# Patient Record
Sex: Female | Born: 1935 | Race: White | Hispanic: No | Marital: Married
Health system: Southern US, Community
[De-identification: ages and names within clinical notes are randomized; demographics above are authoritative.]

## PROBLEM LIST (undated history)

## (undated) DIAGNOSIS — E7800 Pure hypercholesterolemia, unspecified: Secondary | ICD-10-CM

## (undated) DIAGNOSIS — M549 Dorsalgia, unspecified: Secondary | ICD-10-CM

## (undated) DIAGNOSIS — Z950 Presence of cardiac pacemaker: Secondary | ICD-10-CM

## (undated) DIAGNOSIS — E1149 Type 2 diabetes mellitus with other diabetic neurological complication: Secondary | ICD-10-CM

## (undated) DIAGNOSIS — M1990 Unspecified osteoarthritis, unspecified site: Secondary | ICD-10-CM

## (undated) DIAGNOSIS — I4891 Unspecified atrial fibrillation: Secondary | ICD-10-CM

## (undated) DIAGNOSIS — M109 Gout, unspecified: Secondary | ICD-10-CM

## (undated) DIAGNOSIS — M5126 Other intervertebral disc displacement, lumbar region: Secondary | ICD-10-CM

## (undated) DIAGNOSIS — D509 Iron deficiency anemia, unspecified: Secondary | ICD-10-CM

## (undated) DIAGNOSIS — N183 Chronic kidney disease, stage 3 unspecified: Secondary | ICD-10-CM

## (undated) DIAGNOSIS — N281 Cyst of kidney, acquired: Secondary | ICD-10-CM

## (undated) DIAGNOSIS — E612 Magnesium deficiency: Secondary | ICD-10-CM

## (undated) DIAGNOSIS — I5189 Other ill-defined heart diseases: Secondary | ICD-10-CM

## (undated) DIAGNOSIS — G4730 Sleep apnea, unspecified: Secondary | ICD-10-CM

## (undated) DIAGNOSIS — C44601 Unspecified malignant neoplasm of skin of unspecified upper limb, including shoulder: Secondary | ICD-10-CM

## (undated) DIAGNOSIS — K2970 Gastritis, unspecified, without bleeding: Secondary | ICD-10-CM

## (undated) DIAGNOSIS — L309 Dermatitis, unspecified: Secondary | ICD-10-CM

## (undated) DIAGNOSIS — E669 Obesity, unspecified: Secondary | ICD-10-CM

## (undated) DIAGNOSIS — I442 Atrioventricular block, complete: Secondary | ICD-10-CM

## (undated) DIAGNOSIS — G8929 Other chronic pain: Secondary | ICD-10-CM

## (undated) DIAGNOSIS — M48061 Spinal stenosis, lumbar region without neurogenic claudication: Secondary | ICD-10-CM

## (undated) DIAGNOSIS — K219 Gastro-esophageal reflux disease without esophagitis: Secondary | ICD-10-CM

## (undated) DIAGNOSIS — M21619 Bunion of unspecified foot: Secondary | ICD-10-CM

## (undated) DIAGNOSIS — N289 Disorder of kidney and ureter, unspecified: Secondary | ICD-10-CM

## (undated) DIAGNOSIS — J309 Allergic rhinitis, unspecified: Secondary | ICD-10-CM

## (undated) DIAGNOSIS — E1142 Type 2 diabetes mellitus with diabetic polyneuropathy: Secondary | ICD-10-CM

## (undated) DIAGNOSIS — E559 Vitamin D deficiency, unspecified: Secondary | ICD-10-CM

## (undated) DIAGNOSIS — I10 Essential (primary) hypertension: Secondary | ICD-10-CM

## (undated) DIAGNOSIS — L80 Vitiligo: Secondary | ICD-10-CM

## (undated) DIAGNOSIS — R51 Headache: Secondary | ICD-10-CM

## (undated) DIAGNOSIS — K635 Polyp of colon: Secondary | ICD-10-CM

## (undated) HISTORY — DX: Magnesium deficiency: E61.2

## (undated) HISTORY — DX: Dermatitis, unspecified: L30.9

## (undated) HISTORY — DX: Other ill-defined heart diseases: I51.89

## (undated) HISTORY — DX: Vitamin D deficiency, unspecified: E55.9

## (undated) HISTORY — DX: Polyp of colon: K63.5

## (undated) HISTORY — DX: Obesity, unspecified: E66.9

## (undated) HISTORY — DX: Unspecified osteoarthritis, unspecified site: M19.90

## (undated) HISTORY — DX: Gastritis, unspecified, without bleeding: K29.70

## (undated) HISTORY — PX: CARDIAC CATHETERIZATION: SHX172

## (undated) HISTORY — DX: Disorder of kidney and ureter, unspecified: N28.9

## (undated) HISTORY — DX: Type 2 diabetes mellitus with other diabetic neurological complication: E11.49

## (undated) HISTORY — DX: Vitiligo: L80

## (undated) HISTORY — DX: Type 2 diabetes mellitus with diabetic polyneuropathy: E11.42

## (undated) HISTORY — DX: Allergic rhinitis, unspecified: J30.9

## (undated) HISTORY — DX: Atrioventricular block, complete: I44.2

## (undated) HISTORY — PX: BACK SURGERY: SHX140

## (undated) HISTORY — PX: SKIN CANCER EXCISION: SHX779

## (undated) HISTORY — PX: JOINT REPLACEMENT: SHX530

## (undated) HISTORY — DX: Cyst of kidney, acquired: N28.1

## (undated) HISTORY — DX: Chronic kidney disease, stage 3 unspecified: N18.30

## (undated) HISTORY — DX: Other intervertebral disc displacement, lumbar region: M51.26

## (undated) HISTORY — DX: Bunion of unspecified foot: M21.619

## (undated) HISTORY — DX: Chronic kidney disease, stage 3 (moderate): N18.3

---

## 1979-06-30 HISTORY — PX: EXCISIONAL HEMORRHOIDECTOMY: SHX1541

## 1989-06-29 HISTORY — PX: CATARACT EXTRACTION W/ INTRAOCULAR LENS  IMPLANT, BILATERAL: SHX1307

## 1998-07-20 ENCOUNTER — Encounter: Payer: Self-pay | Admitting: Family Medicine

## 1998-07-20 ENCOUNTER — Ambulatory Visit (HOSPITAL_COMMUNITY): Admission: RE | Admit: 1998-07-20 | Discharge: 1998-07-20 | Payer: Self-pay | Admitting: Family Medicine

## 1998-12-14 ENCOUNTER — Ambulatory Visit: Admission: RE | Admit: 1998-12-14 | Discharge: 1998-12-14 | Payer: Self-pay

## 1999-08-02 ENCOUNTER — Encounter: Payer: Self-pay | Admitting: *Deleted

## 1999-08-02 ENCOUNTER — Ambulatory Visit (HOSPITAL_COMMUNITY): Admission: RE | Admit: 1999-08-02 | Discharge: 1999-08-02 | Payer: Self-pay | Admitting: *Deleted

## 2000-04-18 ENCOUNTER — Ambulatory Visit (HOSPITAL_COMMUNITY): Admission: RE | Admit: 2000-04-18 | Discharge: 2000-04-18 | Payer: Self-pay | Admitting: Gastroenterology

## 2000-04-18 ENCOUNTER — Encounter (INDEPENDENT_AMBULATORY_CARE_PROVIDER_SITE_OTHER): Payer: Self-pay | Admitting: Specialist

## 2000-10-02 ENCOUNTER — Encounter: Payer: Self-pay | Admitting: *Deleted

## 2000-10-02 ENCOUNTER — Ambulatory Visit (HOSPITAL_COMMUNITY): Admission: RE | Admit: 2000-10-02 | Discharge: 2000-10-02 | Payer: Self-pay | Admitting: *Deleted

## 2000-10-07 ENCOUNTER — Other Ambulatory Visit: Admission: RE | Admit: 2000-10-07 | Discharge: 2000-10-07 | Payer: Self-pay | Admitting: *Deleted

## 2002-02-23 ENCOUNTER — Emergency Department (HOSPITAL_COMMUNITY): Admission: EM | Admit: 2002-02-23 | Discharge: 2002-02-23 | Payer: Self-pay | Admitting: Emergency Medicine

## 2002-02-23 ENCOUNTER — Encounter: Payer: Self-pay | Admitting: Emergency Medicine

## 2002-02-28 ENCOUNTER — Emergency Department (HOSPITAL_COMMUNITY): Admission: EM | Admit: 2002-02-28 | Discharge: 2002-02-28 | Payer: Self-pay | Admitting: Emergency Medicine

## 2002-03-02 ENCOUNTER — Emergency Department (HOSPITAL_COMMUNITY): Admission: EM | Admit: 2002-03-02 | Discharge: 2002-03-02 | Payer: Self-pay | Admitting: Emergency Medicine

## 2002-08-27 ENCOUNTER — Encounter: Payer: Self-pay | Admitting: *Deleted

## 2002-08-27 ENCOUNTER — Ambulatory Visit (HOSPITAL_COMMUNITY): Admission: RE | Admit: 2002-08-27 | Discharge: 2002-08-27 | Payer: Self-pay | Admitting: *Deleted

## 2002-11-10 ENCOUNTER — Ambulatory Visit (HOSPITAL_COMMUNITY): Admission: RE | Admit: 2002-11-10 | Discharge: 2002-11-11 | Payer: Self-pay | Admitting: Cardiology

## 2002-11-10 HISTORY — PX: PACEMAKER INSERTION: SHX728

## 2002-11-11 ENCOUNTER — Encounter: Payer: Self-pay | Admitting: Cardiology

## 2002-11-30 ENCOUNTER — Ambulatory Visit (HOSPITAL_COMMUNITY): Admission: RE | Admit: 2002-11-30 | Discharge: 2002-11-30 | Payer: Self-pay | Admitting: Gastroenterology

## 2002-11-30 ENCOUNTER — Encounter (INDEPENDENT_AMBULATORY_CARE_PROVIDER_SITE_OTHER): Payer: Self-pay | Admitting: Specialist

## 2003-04-20 ENCOUNTER — Encounter: Admission: RE | Admit: 2003-04-20 | Discharge: 2003-07-19 | Payer: Self-pay | Admitting: *Deleted

## 2005-04-06 ENCOUNTER — Ambulatory Visit (HOSPITAL_COMMUNITY): Admission: RE | Admit: 2005-04-06 | Discharge: 2005-04-06 | Payer: Self-pay | Admitting: Family Medicine

## 2005-10-18 ENCOUNTER — Ambulatory Visit (HOSPITAL_COMMUNITY): Admission: RE | Admit: 2005-10-18 | Discharge: 2005-10-18 | Payer: Self-pay | Admitting: *Deleted

## 2005-10-24 ENCOUNTER — Encounter (INDEPENDENT_AMBULATORY_CARE_PROVIDER_SITE_OTHER): Payer: Self-pay | Admitting: Specialist

## 2005-10-24 ENCOUNTER — Ambulatory Visit (HOSPITAL_COMMUNITY): Admission: RE | Admit: 2005-10-24 | Discharge: 2005-10-24 | Payer: Self-pay | Admitting: Gastroenterology

## 2006-01-02 ENCOUNTER — Ambulatory Visit: Payer: Self-pay | Admitting: Oncology

## 2006-02-24 ENCOUNTER — Ambulatory Visit: Payer: Self-pay | Admitting: Oncology

## 2006-02-26 LAB — CBC WITH DIFFERENTIAL/PLATELET
BASO%: 0.6 % (ref 0.0–2.0)
EOS%: 2.8 % (ref 0.0–7.0)
LYMPH%: 31.7 % (ref 14.0–48.0)
MCHC: 32.7 g/dL (ref 32.0–36.0)
MCV: 77.8 fL — ABNORMAL LOW (ref 81.0–101.0)
MONO%: 8.4 % (ref 0.0–13.0)
Platelets: 314 10*3/uL (ref 145–400)
RBC: 4.68 10*6/uL (ref 3.70–5.32)
RDW: 27.3 % — ABNORMAL HIGH (ref 11.3–14.5)

## 2006-02-27 LAB — IRON AND TIBC
Iron: 60 ug/dL (ref 42–145)
UIBC: 261 ug/dL

## 2006-02-27 LAB — FERRITIN: Ferritin: 550 ng/mL — ABNORMAL HIGH (ref 10–291)

## 2006-04-01 ENCOUNTER — Encounter: Payer: Self-pay | Admitting: Vascular Surgery

## 2006-04-01 ENCOUNTER — Ambulatory Visit: Admission: RE | Admit: 2006-04-01 | Discharge: 2006-04-01 | Payer: Self-pay | Admitting: Family Medicine

## 2006-04-19 ENCOUNTER — Ambulatory Visit: Payer: Self-pay | Admitting: Oncology

## 2006-04-23 LAB — IRON AND TIBC
Iron: 73 ug/dL (ref 42–145)
TIBC: 324 ug/dL (ref 250–470)
UIBC: 251 ug/dL

## 2006-04-23 LAB — CBC WITH DIFFERENTIAL/PLATELET
Basophils Absolute: 0 10*3/uL (ref 0.0–0.1)
Eosinophils Absolute: 0.2 10*3/uL (ref 0.0–0.5)
HCT: 39.1 % (ref 34.8–46.6)
HGB: 13.3 g/dL (ref 11.6–15.9)
MONO#: 0.5 10*3/uL (ref 0.1–0.9)
NEUT%: 53 % (ref 39.6–76.8)
Platelets: 276 10*3/uL (ref 145–400)
WBC: 7.1 10*3/uL (ref 3.9–10.0)
lymph#: 2.5 10*3/uL (ref 0.9–3.3)

## 2006-04-28 HISTORY — PX: TOTAL KNEE ARTHROPLASTY: SHX125

## 2006-04-28 HISTORY — PX: ANKLE FRACTURE SURGERY: SHX122

## 2006-06-28 ENCOUNTER — Ambulatory Visit: Payer: Self-pay | Admitting: Oncology

## 2006-06-28 LAB — CBC WITH DIFFERENTIAL/PLATELET
Basophils Absolute: 0 10*3/uL (ref 0.0–0.1)
EOS%: 3.1 % (ref 0.0–7.0)
Eosinophils Absolute: 0.3 10*3/uL (ref 0.0–0.5)
HGB: 9.1 g/dL — ABNORMAL LOW (ref 11.6–15.9)
LYMPH%: 23.9 % (ref 14.0–48.0)
MCH: 25.6 pg — ABNORMAL LOW (ref 26.0–34.0)
MCV: 79 fL — ABNORMAL LOW (ref 81.0–101.0)
MONO%: 9.4 % (ref 0.0–13.0)
NEUT#: 5.3 10*3/uL (ref 1.5–6.5)
NEUT%: 63.1 % (ref 39.6–76.8)
Platelets: 623 10*3/uL — ABNORMAL HIGH (ref 145–400)
RDW: 20.5 % — ABNORMAL HIGH (ref 11.3–14.5)

## 2006-07-01 LAB — COMPREHENSIVE METABOLIC PANEL
ALT: <8 U/L (ref 0–40)
Albumin: 2.9 g/dL — ABNORMAL LOW (ref 3.5–5.2)
CO2: 23 mEq/L (ref 19–32)
Calcium: 5.5 mg/dL — CL (ref 8.4–10.5)
Chloride: 101 mEq/L (ref 96–112)
Glucose, Bld: 160 mg/dL — ABNORMAL HIGH (ref 70–99)
Potassium: 3.2 mEq/L — ABNORMAL LOW (ref 3.5–5.3)
Sodium: 141 mEq/L (ref 135–145)
Total Protein: 6.2 g/dL (ref 6.0–8.3)

## 2006-07-01 LAB — IRON AND TIBC
Iron: 12 ug/dL — ABNORMAL LOW (ref 42–145)
UIBC: 141 ug/dL

## 2006-07-03 LAB — MAGNESIUM: Magnesium: 0.5 mg/dL — CL (ref 1.5–2.5)

## 2006-07-12 LAB — CBC WITH DIFFERENTIAL/PLATELET
Basophils Absolute: 0.1 10*3/uL (ref 0.0–0.1)
Eosinophils Absolute: 0.1 10*3/uL (ref 0.0–0.5)
HCT: 30.3 % — ABNORMAL LOW (ref 34.8–46.6)
HGB: 9.8 g/dL — ABNORMAL LOW (ref 11.6–15.9)
MONO#: 0.5 10*3/uL (ref 0.1–0.9)
NEUT#: 5.6 10*3/uL (ref 1.5–6.5)
NEUT%: 69.6 % (ref 39.6–76.8)
RDW: 20.9 % — ABNORMAL HIGH (ref 11.3–14.5)
WBC: 8.1 10*3/uL (ref 3.9–10.0)
lymph#: 1.8 10*3/uL (ref 0.9–3.3)

## 2006-07-12 LAB — MAGNESIUM: Magnesium: 0.6 mg/dL — CL (ref 1.5–2.5)

## 2006-07-12 LAB — COMPREHENSIVE METABOLIC PANEL
Albumin: 2.9 g/dL — ABNORMAL LOW (ref 3.5–5.2)
BUN: 16 mg/dL (ref 6–23)
CO2: 22 mEq/L (ref 19–32)
Calcium: 6.6 mg/dL — ABNORMAL LOW (ref 8.4–10.5)
Chloride: 104 mEq/L (ref 96–112)
Creatinine, Ser: 0.94 mg/dL (ref 0.40–1.20)
Potassium: 3.8 mEq/L (ref 3.5–5.3)

## 2006-07-23 LAB — COMPREHENSIVE METABOLIC PANEL
AST: 14 U/L (ref 0–37)
Albumin: 3.5 g/dL (ref 3.5–5.2)
Alkaline Phosphatase: 106 U/L (ref 39–117)
Calcium: 9.4 mg/dL (ref 8.4–10.5)
Chloride: 102 mEq/L (ref 96–112)
Glucose, Bld: 99 mg/dL (ref 70–99)
Potassium: 4.7 mEq/L (ref 3.5–5.3)
Sodium: 139 mEq/L (ref 135–145)
Total Protein: 6.8 g/dL (ref 6.0–8.3)

## 2006-07-23 LAB — CBC WITH DIFFERENTIAL/PLATELET
BASO%: 0.8 % (ref 0.0–2.0)
Eosinophils Absolute: 0.2 10*3/uL (ref 0.0–0.5)
LYMPH%: 42.8 % (ref 14.0–48.0)
MCHC: 32 g/dL (ref 32.0–36.0)
MCV: 81.1 fL (ref 81.0–101.0)
MONO#: 0.5 10*3/uL (ref 0.1–0.9)
MONO%: 8.1 % (ref 0.0–13.0)
NEUT#: 2.8 10*3/uL (ref 1.5–6.5)
Platelets: 486 10*3/uL — ABNORMAL HIGH (ref 145–400)
RBC: 4.49 10*6/uL (ref 3.70–5.32)
RDW: 23.6 % — ABNORMAL HIGH (ref 11.3–14.5)
WBC: 6.3 10*3/uL (ref 3.9–10.0)

## 2006-07-23 LAB — MAGNESIUM: Magnesium: 1.1 mg/dL — ABNORMAL LOW (ref 1.5–2.5)

## 2006-08-06 ENCOUNTER — Encounter: Admission: RE | Admit: 2006-08-06 | Discharge: 2006-09-12 | Payer: Self-pay | Admitting: Orthopedic Surgery

## 2006-09-11 ENCOUNTER — Ambulatory Visit: Payer: Self-pay | Admitting: Oncology

## 2006-09-13 ENCOUNTER — Encounter: Admission: RE | Admit: 2006-09-13 | Discharge: 2006-10-03 | Payer: Self-pay | Admitting: Orthopedic Surgery

## 2006-09-13 LAB — COMPREHENSIVE METABOLIC PANEL
Albumin: 3.6 g/dL (ref 3.5–5.2)
Alkaline Phosphatase: 84 U/L (ref 39–117)
BUN: 17 mg/dL (ref 6–23)
CO2: 28 mEq/L (ref 19–32)
Glucose, Bld: 100 mg/dL — ABNORMAL HIGH (ref 70–99)
Potassium: 4.5 mEq/L (ref 3.5–5.3)
Total Bilirubin: 0.4 mg/dL (ref 0.3–1.2)

## 2006-09-13 LAB — CBC WITH DIFFERENTIAL/PLATELET
Basophils Absolute: 0.1 10*3/uL (ref 0.0–0.1)
Eosinophils Absolute: 0.2 10*3/uL (ref 0.0–0.5)
HGB: 12.6 g/dL (ref 11.6–15.9)
LYMPH%: 41.4 % (ref 14.0–48.0)
MCV: 85.2 fL (ref 81.0–101.0)
MONO#: 0.6 10*3/uL (ref 0.1–0.9)
MONO%: 7.7 % (ref 0.0–13.0)
NEUT#: 3.6 10*3/uL (ref 1.5–6.5)
Platelets: 283 10*3/uL (ref 145–400)

## 2006-09-13 LAB — LACTATE DEHYDROGENASE: LDH: 131 U/L (ref 94–250)

## 2006-09-13 LAB — IRON AND TIBC: TIBC: 248 ug/dL — ABNORMAL LOW (ref 250–470)

## 2006-09-13 LAB — FERRITIN: Ferritin: 444 ng/mL — ABNORMAL HIGH (ref 10–291)

## 2006-09-13 LAB — MAGNESIUM: Magnesium: 1.5 mg/dL (ref 1.5–2.5)

## 2006-10-04 ENCOUNTER — Encounter: Admission: RE | Admit: 2006-10-04 | Discharge: 2006-12-25 | Payer: Self-pay | Admitting: Orthopedic Surgery

## 2006-10-04 LAB — CBC WITH DIFFERENTIAL/PLATELET
BASO%: 0.7 % (ref 0.0–2.0)
Basophils Absolute: 0 10*3/uL (ref 0.0–0.1)
EOS%: 3.5 % (ref 0.0–7.0)
HGB: 11.8 g/dL (ref 11.6–15.9)
MCH: 28.6 pg (ref 26.0–34.0)
RDW: 17 % — ABNORMAL HIGH (ref 11.3–14.5)
lymph#: 3.2 10*3/uL (ref 0.9–3.3)

## 2006-10-25 LAB — CBC WITH DIFFERENTIAL/PLATELET
Basophils Absolute: 0 10*3/uL (ref 0.0–0.1)
Eosinophils Absolute: 0.2 10*3/uL (ref 0.0–0.5)
HGB: 13.1 g/dL (ref 11.6–15.9)
MCV: 85.7 fL (ref 81.0–101.0)
MONO#: 0.4 10*3/uL (ref 0.1–0.9)
NEUT#: 7.6 10*3/uL — ABNORMAL HIGH (ref 1.5–6.5)
RDW: 16.5 % — ABNORMAL HIGH (ref 11.3–14.5)
WBC: 10.7 10*3/uL — ABNORMAL HIGH (ref 3.9–10.0)
lymph#: 2.5 10*3/uL (ref 0.9–3.3)

## 2006-11-04 ENCOUNTER — Ambulatory Visit: Payer: Self-pay | Admitting: Oncology

## 2006-11-07 LAB — COMPREHENSIVE METABOLIC PANEL
AST: 35 U/L (ref 0–37)
Albumin: 3.7 g/dL (ref 3.5–5.2)
Alkaline Phosphatase: 115 U/L (ref 39–117)
Glucose, Bld: 109 mg/dL — ABNORMAL HIGH (ref 70–99)
Potassium: 3.9 mEq/L (ref 3.5–5.3)
Sodium: 141 mEq/L (ref 135–145)
Total Bilirubin: 0.4 mg/dL (ref 0.3–1.2)
Total Protein: 7 g/dL (ref 6.0–8.3)

## 2006-11-07 LAB — CBC WITH DIFFERENTIAL/PLATELET
Basophils Absolute: 0.1 10*3/uL (ref 0.0–0.1)
EOS%: 2.8 % (ref 0.0–7.0)
HCT: 37.2 % (ref 34.8–46.6)
HGB: 12.2 g/dL (ref 11.6–15.9)
LYMPH%: 43.7 % (ref 14.0–48.0)
MCH: 28 pg (ref 26.0–34.0)
MCV: 85 fL (ref 81.0–101.0)
NEUT%: 44.3 % (ref 39.6–76.8)
Platelets: 589 10*3/uL — ABNORMAL HIGH (ref 145–400)
lymph#: 3 10*3/uL (ref 0.9–3.3)

## 2006-11-07 LAB — IRON AND TIBC
%SAT: 23 % (ref 20–55)
Iron: 54 ug/dL (ref 42–145)
UIBC: 183 ug/dL

## 2006-11-07 LAB — FERRITIN: Ferritin: 438 ng/mL — ABNORMAL HIGH (ref 10–291)

## 2006-11-22 LAB — CBC WITH DIFFERENTIAL/PLATELET
BASO%: 0.6 % (ref 0.0–2.0)
EOS%: 2.9 % (ref 0.0–7.0)
HCT: 36.8 % (ref 34.8–46.6)
HGB: 12 g/dL (ref 11.6–15.9)
MCH: 27.8 pg (ref 26.0–34.0)
MCHC: 32.5 g/dL (ref 32.0–36.0)
MONO#: 0.5 10*3/uL (ref 0.1–0.9)
RDW: 17.5 % — ABNORMAL HIGH (ref 11.3–14.5)
WBC: 6.3 10*3/uL (ref 3.9–10.0)
lymph#: 2.8 10*3/uL (ref 0.9–3.3)

## 2006-12-06 ENCOUNTER — Encounter: Admission: RE | Admit: 2006-12-06 | Discharge: 2006-12-06 | Payer: Self-pay | Admitting: Orthopaedic Surgery

## 2006-12-17 ENCOUNTER — Ambulatory Visit: Payer: Self-pay | Admitting: Oncology

## 2006-12-19 LAB — CBC WITH DIFFERENTIAL/PLATELET
Basophils Absolute: 0 10*3/uL (ref 0.0–0.1)
EOS%: 3.3 % (ref 0.0–7.0)
Eosinophils Absolute: 0.2 10*3/uL (ref 0.0–0.5)
HCT: 37.6 % (ref 34.8–46.6)
HGB: 12.7 g/dL (ref 11.6–15.9)
MONO#: 0.5 10*3/uL (ref 0.1–0.9)
NEUT#: 3.3 10*3/uL (ref 1.5–6.5)
NEUT%: 48.1 % (ref 39.6–76.8)
RDW: 17 % — ABNORMAL HIGH (ref 11.3–14.5)
WBC: 6.8 10*3/uL (ref 3.9–10.0)
lymph#: 2.8 10*3/uL (ref 0.9–3.3)

## 2007-01-02 ENCOUNTER — Encounter: Admission: RE | Admit: 2007-01-02 | Discharge: 2007-01-02 | Payer: Self-pay | Admitting: Orthopaedic Surgery

## 2007-01-13 ENCOUNTER — Encounter: Admission: RE | Admit: 2007-01-13 | Discharge: 2007-01-13 | Payer: Self-pay | Admitting: Orthopaedic Surgery

## 2007-01-17 LAB — CBC WITH DIFFERENTIAL/PLATELET
BASO%: 0.5 % (ref 0.0–2.0)
Basophils Absolute: 0 10*3/uL (ref 0.0–0.1)
EOS%: 1.5 % (ref 0.0–7.0)
HCT: 39.3 % (ref 34.8–46.6)
LYMPH%: 31.2 % (ref 14.0–48.0)
MCH: 29 pg (ref 26.0–34.0)
MCHC: 34.6 g/dL (ref 32.0–36.0)
MCV: 83.8 fL (ref 81.0–101.0)
MONO%: 8.3 % (ref 0.0–13.0)
NEUT%: 58.5 % (ref 39.6–76.8)
Platelets: 300 10*3/uL (ref 145–400)

## 2007-02-11 ENCOUNTER — Ambulatory Visit: Payer: Self-pay | Admitting: Oncology

## 2007-02-14 LAB — CBC WITH DIFFERENTIAL/PLATELET
BASO%: 0.5 % (ref 0.0–2.0)
EOS%: 2.3 % (ref 0.0–7.0)
MCH: 29.8 pg (ref 26.0–34.0)
MCHC: 35 g/dL (ref 32.0–36.0)
MCV: 85.2 fL (ref 81.0–101.0)
MONO%: 7.4 % (ref 0.0–13.0)
RDW: 15.9 % — ABNORMAL HIGH (ref 11.3–14.5)
lymph#: 2.4 10*3/uL (ref 0.9–3.3)

## 2007-02-14 LAB — COMPREHENSIVE METABOLIC PANEL
ALT: 10 U/L (ref 0–35)
AST: 13 U/L (ref 0–37)
Albumin: 4 g/dL (ref 3.5–5.2)
Alkaline Phosphatase: 59 U/L (ref 39–117)
Calcium: 9.1 mg/dL (ref 8.4–10.5)
Chloride: 105 mEq/L (ref 96–112)
Creatinine, Ser: 1.13 mg/dL (ref 0.40–1.20)
Potassium: 4.1 mEq/L (ref 3.5–5.3)

## 2007-02-14 LAB — IRON AND TIBC
TIBC: 293 ug/dL (ref 250–470)
UIBC: 235 ug/dL

## 2007-02-14 LAB — FERRITIN: Ferritin: 299 ng/mL — ABNORMAL HIGH (ref 10–291)

## 2007-02-26 ENCOUNTER — Ambulatory Visit: Payer: Self-pay | Admitting: Pulmonary Disease

## 2007-04-09 ENCOUNTER — Ambulatory Visit: Payer: Self-pay | Admitting: Oncology

## 2007-04-10 ENCOUNTER — Encounter: Admission: RE | Admit: 2007-04-10 | Discharge: 2007-04-10 | Payer: Self-pay | Admitting: Orthopaedic Surgery

## 2007-04-11 ENCOUNTER — Ambulatory Visit (HOSPITAL_BASED_OUTPATIENT_CLINIC_OR_DEPARTMENT_OTHER): Admission: RE | Admit: 2007-04-11 | Discharge: 2007-04-11 | Payer: Self-pay | Admitting: Pulmonary Disease

## 2007-04-11 ENCOUNTER — Ambulatory Visit: Payer: Self-pay | Admitting: Pulmonary Disease

## 2007-04-11 LAB — CBC WITH DIFFERENTIAL/PLATELET
BASO%: 0.5 % (ref 0.0–2.0)
Basophils Absolute: 0 10*3/uL (ref 0.0–0.1)
EOS%: 1 % (ref 0.0–7.0)
HGB: 12.3 g/dL (ref 11.6–15.9)
MCH: 29.1 pg (ref 26.0–34.0)
MCV: 85.6 fL (ref 81.0–101.0)
MONO%: 7.1 % (ref 0.0–13.0)
RBC: 4.21 10*6/uL (ref 3.70–5.32)
RDW: 15.1 % — ABNORMAL HIGH (ref 11.3–14.5)
lymph#: 2.2 10*3/uL (ref 0.9–3.3)

## 2007-05-05 ENCOUNTER — Ambulatory Visit: Payer: Self-pay | Admitting: Pulmonary Disease

## 2007-05-28 ENCOUNTER — Ambulatory Visit: Payer: Self-pay | Admitting: Oncology

## 2007-05-30 ENCOUNTER — Encounter: Admission: RE | Admit: 2007-05-30 | Discharge: 2007-05-30 | Payer: Self-pay | Admitting: Orthopaedic Surgery

## 2007-05-30 LAB — FERRITIN: Ferritin: 265 ng/mL (ref 10–291)

## 2007-05-30 LAB — CBC WITH DIFFERENTIAL/PLATELET
Basophils Absolute: 0.1 10*3/uL (ref 0.0–0.1)
Eosinophils Absolute: 0.3 10*3/uL (ref 0.0–0.5)
HGB: 13 g/dL (ref 11.6–15.9)
MCV: 85.8 fL (ref 81.0–101.0)
MONO#: 0.6 10*3/uL (ref 0.1–0.9)
MONO%: 9 % (ref 0.0–13.0)
NEUT#: 3.3 10*3/uL (ref 1.5–6.5)
RBC: 4.36 10*6/uL (ref 3.70–5.32)
RDW: 14.6 % — ABNORMAL HIGH (ref 11.3–14.5)
WBC: 6.5 10*3/uL (ref 3.9–10.0)
lymph#: 2.3 10*3/uL (ref 0.9–3.3)

## 2007-05-30 LAB — COMPREHENSIVE METABOLIC PANEL
Albumin: 4.4 g/dL (ref 3.5–5.2)
Alkaline Phosphatase: 61 U/L (ref 39–117)
BUN: 22 mg/dL (ref 6–23)
Calcium: 9.6 mg/dL (ref 8.4–10.5)
Chloride: 104 mEq/L (ref 96–112)
Glucose, Bld: 107 mg/dL — ABNORMAL HIGH (ref 70–99)
Potassium: 4.4 mEq/L (ref 3.5–5.3)
Sodium: 144 mEq/L (ref 135–145)
Total Protein: 7.2 g/dL (ref 6.0–8.3)

## 2007-05-30 LAB — IRON AND TIBC
Iron: 62 ug/dL (ref 42–145)
UIBC: 219 ug/dL

## 2007-07-08 ENCOUNTER — Encounter: Admission: RE | Admit: 2007-07-08 | Discharge: 2007-07-08 | Payer: Self-pay | Admitting: Oncology

## 2007-07-25 ENCOUNTER — Encounter: Admission: RE | Admit: 2007-07-25 | Discharge: 2007-07-25 | Payer: Self-pay | Admitting: Orthopaedic Surgery

## 2007-10-09 ENCOUNTER — Encounter: Admission: RE | Admit: 2007-10-09 | Discharge: 2007-10-09 | Payer: Self-pay | Admitting: Orthopaedic Surgery

## 2008-04-06 ENCOUNTER — Encounter: Admission: RE | Admit: 2008-04-06 | Discharge: 2008-06-07 | Payer: Self-pay | Admitting: Orthopaedic Surgery

## 2008-09-02 ENCOUNTER — Ambulatory Visit (HOSPITAL_COMMUNITY): Admission: RE | Admit: 2008-09-02 | Discharge: 2008-09-02 | Payer: Self-pay | Admitting: Family Medicine

## 2010-01-27 ENCOUNTER — Ambulatory Visit (HOSPITAL_COMMUNITY): Admission: RE | Admit: 2010-01-27 | Discharge: 2010-01-27 | Payer: Self-pay | Admitting: Family Medicine

## 2010-03-13 ENCOUNTER — Observation Stay (HOSPITAL_COMMUNITY): Admission: RE | Admit: 2010-03-13 | Discharge: 2010-03-13 | Payer: Self-pay | Admitting: Cardiology

## 2010-03-13 HISTORY — PX: PACEMAKER GENERATOR CHANGE: SHX5998

## 2010-06-21 ENCOUNTER — Encounter (HOSPITAL_BASED_OUTPATIENT_CLINIC_OR_DEPARTMENT_OTHER): Admission: RE | Admit: 2010-06-21 | Discharge: 2010-08-25 | Payer: Self-pay | Admitting: Internal Medicine

## 2010-06-29 ENCOUNTER — Ambulatory Visit: Payer: Self-pay | Admitting: Surgery

## 2010-08-14 ENCOUNTER — Ambulatory Visit (HOSPITAL_COMMUNITY): Admission: RE | Admit: 2010-08-14 | Discharge: 2010-08-14 | Payer: Self-pay | Admitting: Family Medicine

## 2010-08-15 ENCOUNTER — Encounter: Admission: RE | Admit: 2010-08-15 | Discharge: 2010-08-15 | Payer: Self-pay | Admitting: Nephrology

## 2010-11-19 ENCOUNTER — Encounter: Payer: Self-pay | Admitting: Family Medicine

## 2010-11-24 ENCOUNTER — Inpatient Hospital Stay (HOSPITAL_COMMUNITY)
Admission: EM | Admit: 2010-11-24 | Discharge: 2010-11-26 | Payer: Self-pay | Source: Home / Self Care | Attending: Internal Medicine | Admitting: Internal Medicine

## 2010-11-24 LAB — DIFFERENTIAL
Eosinophils Absolute: 0.4 10*3/uL (ref 0.0–0.7)
Lymphocytes Relative: 27 % (ref 12–46)
Neutro Abs: 4 10*3/uL (ref 1.7–7.7)

## 2010-11-24 LAB — BASIC METABOLIC PANEL
BUN: 75 mg/dL — ABNORMAL HIGH (ref 6–23)
CO2: 25 mEq/L (ref 19–32)
Calcium: 9.8 mg/dL (ref 8.4–10.5)
Creatinine, Ser: 2.66 mg/dL — ABNORMAL HIGH (ref 0.4–1.2)
GFR calc Af Amer: 21 mL/min — ABNORMAL LOW (ref >60)
GFR calc non Af Amer: 17 mL/min — ABNORMAL LOW (ref >60)
Potassium: 7.2 mEq/L (ref 3.5–5.1)
Sodium: 134 mEq/L — ABNORMAL LOW (ref 135–145)

## 2010-11-24 LAB — CBC
Hemoglobin: 11 g/dL — ABNORMAL LOW (ref 12.0–15.0)
MCV: 88.5 fL (ref 78.0–100.0)
WBC: 6.9 10*3/uL (ref 4.0–10.5)

## 2010-11-25 LAB — URINE MICROSCOPIC-ADD ON

## 2010-11-25 LAB — RENAL FUNCTION PANEL
BUN: 57 mg/dL — ABNORMAL HIGH (ref 6–23)
Chloride: 100 mEq/L (ref 96–112)
GFR calc Af Amer: 27 mL/min — ABNORMAL LOW (ref >60)
Sodium: 138 mEq/L (ref 135–145)

## 2010-11-25 LAB — BASIC METABOLIC PANEL
BUN: 69 mg/dL — ABNORMAL HIGH (ref 6–23)
CO2: 24 mEq/L (ref 19–32)
Creatinine, Ser: 2.47 mg/dL — ABNORMAL HIGH (ref 0.4–1.2)
GFR calc Af Amer: 25 mL/min — ABNORMAL LOW (ref >60)
Glucose, Bld: 95 mg/dL (ref 70–99)
Glucose, Bld: 99 mg/dL (ref 70–99)
Potassium: 5.4 mEq/L — ABNORMAL HIGH (ref 3.5–5.1)
Potassium: 4.3 mEq/L (ref 3.5–5.1)
Sodium: 141 mEq/L (ref 135–145)
Sodium: 140 mEq/L (ref 135–145)

## 2010-11-25 LAB — URINALYSIS, ROUTINE W REFLEX MICROSCOPIC
Nitrite: NEGATIVE
Specific Gravity, Urine: 1.008 (ref 1.005–1.030)

## 2010-11-25 LAB — CBC
MCH: 28.5 pg (ref 26.0–34.0)
MCV: 87.8 fL (ref 78.0–100.0)
Platelets: 274 10*3/uL (ref 150–400)
RBC: 4 MIL/uL (ref 3.87–5.11)

## 2010-11-25 LAB — IRON AND TIBC
Iron: 44 ug/dL (ref 42–135)
Saturation Ratios: 18 % — ABNORMAL LOW (ref 20–55)
TIBC: 249 ug/dL — ABNORMAL LOW (ref 250–470)
UIBC: 205 ug/dL

## 2010-11-25 LAB — TSH: TSH: 1.761 u[IU]/mL (ref 0.350–4.500)

## 2010-11-25 LAB — GLUCOSE, CAPILLARY
Glucose-Capillary: 118 mg/dL — ABNORMAL HIGH (ref 70–99)
Glucose-Capillary: 190 mg/dL — ABNORMAL HIGH (ref 70–99)

## 2010-11-26 LAB — GLUCOSE, CAPILLARY

## 2010-11-26 LAB — CBC
HCT: 31.8 % — ABNORMAL LOW (ref 36.0–46.0)
Hemoglobin: 10.4 g/dL — ABNORMAL LOW (ref 12.0–15.0)
RBC: 3.63 MIL/uL — ABNORMAL LOW (ref 3.87–5.11)
WBC: 7.8 10*3/uL (ref 4.0–10.5)

## 2010-11-26 LAB — COMPREHENSIVE METABOLIC PANEL
ALT: 11 U/L (ref 0–35)
Alkaline Phosphatase: 77 U/L (ref 39–117)
CO2: 27 mEq/L (ref 19–32)
Chloride: 101 mEq/L (ref 96–112)
GFR calc non Af Amer: 22 mL/min — ABNORMAL LOW (ref >60)
Glucose, Bld: 123 mg/dL — ABNORMAL HIGH (ref 70–99)
Potassium: 3.4 mEq/L — ABNORMAL LOW (ref 3.5–5.1)
Sodium: 140 mEq/L (ref 135–145)
Total Protein: 6 g/dL (ref 6.0–8.3)

## 2010-11-26 LAB — PHOSPHORUS: Phosphorus: 4.2 mg/dL (ref 2.3–4.6)

## 2010-11-27 NOTE — Consult Note (Signed)
Tara Harmon, Tara Harmon                ACCOUNT NO.:  [id number]  MEDICAL RECORD NO.:  [id number]           PATIENT TYPE:  LOCATION:                                 FACILITY:  PHYSICIAN:  Demarie Hyneman L. Kalle Bernath, M.D.DATE OF BIRTH:  06-19-1931  DATE OF CONSULTATION: DATE OF DISCHARGE:                                CONSULTATION   REASON FOR CONSULTATION: 1. Acute kidney injury. 2. Chronic kidney disease. 3. Hyperkalemia.  CONSULTING PHYSICIAN:  [facility].  HISTORY OF PRESENT ILLNESS:  This is a 77 year old female with greater than 33 years of diabetes mellitus and over that long of hypertension also, some problems during her 2 pregnancies, history of DJD, left total knee replacement, history of left ankle fracture, history of peripheral neuropathy, hyperlipidemia, and heart block with sick sinus syndrome and pacemaker placement in 2004.  She was sent here by her primary doctor, Dr. Laurann Montana for hyperkalemia.  In the past, she has apparently had some chronic kidney disease and saw Dr. Eliott Harmon a few months ago, only seen once.  Meds at home include lisinopril, losartan, tramadol, metoprolol, Lasix, simvastatin, and she has been taking nonsteroidals. Going over her diet, she has had apparently a high-potassium diet with vegetables and fruits.  She has no history of UTI or stones.  No family history of renal disease.  She has history of pacemaker and heart block in 2004.  Past ultrasound showed cortical thinning of both kidneys.  She does not relate muscle weakness.  She does not relate near syncope, lightheadedness, dizziness, or falling down.  PAST MEDICAL HISTORY:  As listed above.  MEDICATIONS:  As listed above.  She apparently has some allergies to ERYTHROMYCIN and PIOGLITAZONE.  REVIEW OF SYSTEMS:  HEENT:  She wears glasses.  Otherwise, no complaints of vision, sore throat, dry eyes, or dry mouth.  No headaches.  No hearing difficulties.  PULMONARY:  Never been a  smoker.  No cough or sputum production.  No asthma or hay fever.  GI:  She tends to be slightly constipated.  No ingestion, heartburn, nausea, vomiting, or diarrhea.  No history of hepatitis or jaundice.  SKIN:  History of vitiligo, also has history of skin cancers that have been removed and she had surgery for that.  MUSCULOSKELETAL:  Arthritis in knees, hips, ankles, and shoulders and she has had a knee replacement.  NEUROLOGICAL: She has some peripheral neuropathy with numbness and tingling in her feet and takes gabapentin for that.  PAST SURGICAL HISTORY:  She has had a knee replacement.  She has apparently had an ankle fixation in the past.  She has had tonsils in the distant past.  She has had another skin cancers removed.  She has had a pacemaker.  FAMILY HISTORY:  Father died at age 85 of lung cancer.  Mother died in her 55s of heart problems.  She had a sister who died at age 64 of heart problems.  She has 3 sisters who have diabetes and high blood pressure. Also, she has 2 children who are apparently healthy.  OBJECTIVE/PHYSICAL EXAMINATION:  VITAL SIGNS:  Blood pressure 147/47, temperature 98.1,  and O2 saturation 97% on room air. GENERAL:  Cooperative, in no acute distress. HEENT:  Discs and retinal vessels are unremarkable.  Pharynx shows an upper plate.  Ears show cerumen impaction. NECK:  Without masses or thyromegaly. LUNGS:  No rales, rhonchi, or wheezes.  Slight decreased breath sounds and decreased expansion.  No resonance to percussion. CARDIOVASCULAR:  Regular rhythm, paced, rate running about 100-110. Grade 2/6 holosystolic murmur best heard at left lower sternal border and apex.  PMI is 11 cm out from the left midclavicular line in the fifth intercostal space.  She has trace edema.  Slight decreased dorsalis pedal pulses.  No bruits noted.  No lifts, heaves, or thrills. ABDOMEN:  Active bowel sounds.  Liver is down 4 cm.  Soft and nontender. SKIN:  Irregular  pigmentation on the abdomen and back consistent with vitiligo.  No significant adenopathy in the axilla or supraclavicular. She has posterior cervical adenopathy. MUSCULOSKELETAL:  Severe hypertrophic changes in both wrists and the left knee more than the right knee.  She has some scar over the left knee.  Decreased range of motion of left ankle.  There are Charcot joints in both ankles. NEUROLOGICAL:  4/5 strength in the upper and lower extremities.  Deep tendon reflexes are 1+ in the patella, biceps, and triceps, trace in the patella, trace in the Achilles.  Toes are downgoing.  LABORATORY DATA:  Sodium 134, potassium 7.2, chloride 95, bicarb 25, creatinine 2.66, BUN 77, and glucose 105.  Hemoglobin 11, white count 6.9, and platelets 297,000.  ASSESSMENT: 1. Chronic kidney disease with acute kidney injury:  Baseline     creatinine is unknown at this time.  Potassium is increased     secondary to ACE inhibitor, ARBs, and nonsteroidals in a     combination with a high-potassium diet.  Volume is fair at this     time.  Acid-base is okay.  Need to aggressively treat her potassium     and get workup as far as exacerbating factors otherwise.  Suspect     this is all related to her medication and possibly some mild volume     depletion.  Rule out other causes.  Also rule out hypoadrenalism. 2. Diabetes mellitus:  Monitor her sugars and follow that closely. 3. Hyporeninemic hypoaldosterone, but she does not have an RTA. 4. Renal hypertension:  With her erratic diet and history, I would     avoid __________ and active agents. 5. Degenerative joint disease. 6. Anemia:  We will work this up with __________ at this time.  PLAN: 1. Calcium, glucose, insulin, Kayexalate. 2. Lasix. 3. Check potassium and follow up. 4. Ultrasound. 5. Serum cortisol.          ______________________________ Llana Aliment. Sharma Lawrance, M.D.     JLD/MEDQ  D:  11/24/2010  T:  11/25/2010  Job:   [id number]  Electronically Signed by Beryle Lathe M.D. on 11/27/2010 06:42:16 PM

## 2010-11-28 NOTE — Discharge Summary (Signed)
Tara Harmon, Tara Harmon                ACCOUNT NO.:  [id number]  MEDICAL RECORD NO.:  [id number]          PATIENT TYPE:  INP  LOCATION:  2501                         FACILITY:  [facility]  PHYSICIAN:  Lonia Blood, M.D.DATE OF BIRTH:  1931/07/18  DATE OF ADMISSION:  11/24/2010 DATE OF DISCHARGE:  11/26/2010                              DISCHARGE SUMMARY   PRIMARY CARE PHYSICIAN:  Stacie Acres. Cliffton Asters, MD with [facility].  NEPHROLOGIST:  Duke Salvia. Eliott Nine, MD with [facility].  DISCHARGE DIAGNOSES: 1. Severe hyperkalemia.     a.     Felt to be related to high potassium diet plus ARB.     b.     Corrected with temporizing measures and subsequently with      Kayexalate.     c.     Potassium 3.4 at time of discharge. 2. Acute versus chronic kidney disease.     a.     Renal ultrasound, suggest chronic medical renal disease.     b.     No evidence of acute hydronephrosis or acute tubular      necrosis.     c.     Creatinine stabilized at approximately 2.2.     d.     To follow up with Dr. Eliott Nine in the outpatient setting at      recommendation of Nephrology consultation. 3. Asymptomatic bacteriuria.     a.     Urine culture pending.     b.     Short course of Bactrim therapy empirically initiated. 4. Shingles - acyclovir therapy initiated. 5. Diet-controlled diabetes mellitus. 6. Hyperlipidemia. 7. Hypertension. 8. Osteoarthritis status post left total knee replacement. 9. History of spinal stenosis status post lumbar surgery. 10.Status post bilateral cataract extractions. 11.History of complete heart block status post pacemaker placement     with battery change, September 2011. 12.Anemia - likely related to renal disease - iron therapy initiated.  DISCHARGE MEDICATIONS:  A complete list of the patient's discharge medications is available as per the discharge med manager portion of the patient's eChart computer system file.  Of note, the patient is  being discharged home on: 1. Acyclovir 800 mg t.i.d. for 6 days. 2. Septra double strength 1 tablet a day for 2 additional days. 3. Iron sulfate 325 mg b.i.d. over-the-counter. She otherwise will continue all of her usual medications with exception to the fact that her losartan has been discontinued for now.  It should be noted that it is not felt that she has an absolute contraindication to ARBs or ACE inhibitors but it is felt important to discontinue these medications in the short course until the stability of her potassium and renal function can be established.  CONSULTATIONS:  [employer].  PROCEDURES:  Renal ultrasound November 25, 2010 - no evidence of hydronephrosis involving either kidney.  Diffuse cortical thinning involving both kidneys with ectogenic parenchyma consistent with medical renal disease and small bilateral renal cysts with no significant change from the prior study of October 2011.  FOLLOWUP: 1. The patient is instructed to follow up with Dr. Laurann Montana  within 3-5 days.  At that time the patient should have a BMET drawn     to assure that her potassium is stable and that her creatinine is     also stable.  At the time of her discharge her potassium is 3.4     with a creatinine of 2.2. 2. Dr. Fayrene Fearing Deterding instructs the patient that [facility] will call her with a scheduled followup appointment with     Dr. Camille Bal.  HOSPITAL COURSE:  Tara Harmon is a very pleasant 77 year old female who lives independently in the [place] area.  She had presented to her primary care physician and had blood work done approximately 48 hours prior to her admission.  Unfortunately this blood work returned to reveal a potassium which was markedly elevated at 6.6.  The patient was then immediately contacted and instructed that she should present to the emergency room.  The patient did so and her hyperkalemia was  confirmed with an admitting potassium of 7.2.  There was evidence of peaking of the T-waves but fortunately the patient's pacemaker was maintaining a regular rate and rhythm.  The patient was given the usual acute temporizing measures to provide cardioprotective effect in regard to her hyperkalemia.  She was then dosed with Kayexalate.  She was admitted to the acute unit.  Nephrology consultation was carried out.  The patient's ARB was discontinued.  The patient was gently hydrated.  Further evaluation revealed evidence of a possible urinary tract infection.  The patient denied symptoms.  Urine was sent for culture and the decision was made to treat the patient with a short 3-day course of Septra double strength.  Serial followup of the patient's potassium proved that it was stable at less than 3.5.  Further dosing of Kayexalate was therefore not indicated.  The patient's blood pressure was reasonably controlled with discontinuation of her ARB.  She was given a one-time high dose of Lasix therapy and then and has returned to her usual Lasix therapy of 40 mg this day.  During the patient's hospital stay she suffered the acute onset of a vesicular eruption in a dermatomal pattern under the right breast. Inspection of these lesions reveal that they were in fact most consistent with herpes zoster.  A course of acyclovir was administered. The patient tolerated this without any difficulty.  At the time of her discharge, the patient's lesions are already begriming to crust over and there is no evidence of new eruptions.  The patient has been advised that she should avoid direct contact with anyone who was not had a history of chickenpox, very young children or infant's.  By November 26, 2010 the patient was deemed to be medically stable. Vital signs were stable and she was afebrile.  Potassium was 3.4 and creatinine had stabilized around 2.2.  She was ambulating independently and had no  further complaints.  She was therefore cleared for discharge with the above-noted followup recommendations.     Lonia Blood, M.D.     JTM/MEDQ  D:  11/26/2010  T:  11/26/2010  Job:  [id number]  cc:   Stacie Acres. Cliffton Asters, M.D. Duke Salvia Eliott Nine, M.D.  Electronically Signed by Jetty Duhamel M.D. on 11/28/2010 09:50:31 AM

## 2011-01-01 NOTE — H&P (Signed)
NAMEMURREL, FREET                ACCOUNT NO.:  [id number]  MEDICAL RECORD NO.:  [id number]          PATIENT TYPE:  INP  LOCATION:  1840                         FACILITY:  [facility]  PHYSICIAN:  Della Goo, M.D. DATE OF BIRTH:  [birth year]  DATE OF ADMISSION:  11/24/2010 DATE OF DISCHARGE:                             HISTORY & PHYSICAL   PRIMARY CARE PHYSICIAN:  Stacie Acres. White, MD  CHIEF COMPLAINT:  Abnormal laboratory studies.  HISTORY OF PRESENT ILLNESS:  This is a 77 year old female who was told to report to the emergency department immediately after abnormal laboratory studies had returned.  The patient had seen her primary care physician 2 days ago and had blood work performed secondary to worsening kidney function over the past few months.  The laboratory studies returned with an elevated potassium level of 6.6 and the patient was advised to go immediately to the emergency department.  Her BUN and creatinine were also more elevated.  The patient denied having any chest pain or palpitations.  She does have a pacemaker.  When the patient was seen in the emergency department, she was evaluated and laboratory studies were repeated and her potassium level was found to be 7.2 with a BUN of 75 and creatinine of 2.66.  The patient is on ACE inhibitor therapy and ARB therapy.  She is also on magnesium replacement therapy. She states her lisinopril which was 80 mg was discontinued yesterday secondary to her abnormal lab studies.  The patient also reports that she has been on a fast this week.  She called the [employer] and she has been eating more bananas and potatoes as well.  PAST MEDICAL HISTORY:  Significant for hypertension, diet-controlled diabetes mellitus, hyperlipidemia, arthritis, chronic back pain.  PAST SURGICAL HISTORY:  History of a dual-chamber pacemaker, left total knee replacement, open reduction and internal fixation left ankle fracture, back surgery  secondary to spinal stenosis, and bilateral cataract surgery.  Her medications will need to be further verified.  The patient had been on lisinopril 80 mg one p.o. daily, this was stopped 1 day ago secondary to abnormal labs.  She was also on losartan and magnesium, gabapentin, furosemide, metoprolol tartrate, and clonidine and Ultram.  ALLERGIES:  No known drug allergies.  SOCIAL HISTORY:  The patient is a nonsmoker, nondrinker.  No history of illicit drug usage.  FAMILY HISTORY:  Noncontributory.  REVIEW OF SYSTEMS:  Pertinent as mentioned above.  PHYSICAL EXAMINATION FINDINGS:  GENERAL:  This is a pleasant 77 year old well-nourished, well-developed Caucasian female in no acute distress currently. VITAL SIGNS:  Temperature 98.1, blood pressure 147/47, heart rate 97, respirations 20, O2 sats 97%. HEENT:  Normocephalic, atraumatic.  Pupils are reactive to light bilaterally.  She has surgical changes from her cataract surgeries in both pupils.  Extraocular movements are intact.  Funduscopic benign. There is no scleral icterus.  Nares are patent bilaterally.  Oropharynx is clear. NECK:  Supple.  Full range of motion.  No thyromegaly, adenopathy, or jugular venous distention. CARDIOVASCULAR:  Regular paced rhythm.  No murmurs, gallops, or rubs appreciated. LUNGS:  Clear to auscultation bilaterally.  No rales,  rhonchi, or wheezes. ABDOMEN:  Positive bowel sounds, soft, nontender, nondistended.  No hepatosplenomegaly. EXTREMITIES:  Without cyanosis, clubbing, or edema. NEUROLOGIC:  Nonfocal.  LABORATORY STUDIES:  White blood cell count 6.9, hemoglobin 11.0, hematocrit 34.8, MCV 88.5, platelets 197,000, neutrophils 58%, lymphocytes 27%.  Sodium 134, potassium 7.2, chloride 97, CO2 25, BUN 75, creatinine 2.66, and glucose 105.  EKG reveals a paced AV rhythm and tenting T-waves.  However, on previous EKGs that were performed, the T- waves appeared to have the same  morphology.  ASSESSMENT:  A 77 year old female being admitted with: 1. Hyperkalemia, most likely secondary to her medications and her     intake of foods with excess potassium. 2. Acute renal failure with chronic kidney disease, stage III. 3. Hypertension. 4. Hyperlipidemia. 5. Diet-controlled type 2 diabetes mellitus. 6. Arthritis. 7. Chronic back pain.  PLAN:  The patient will be admitted to the step-down ICU area for monitoring.  She has been started on therapy to treat her hyperkalemia and the initial dose given by the ED was 30 g of Kayexalate p.o. x1, also short-term therapy of calcium gluconate, IV insulin, and IV dextrose were given.  A repeat potassium level will be performed in 6 hours after the Kayexalate had been given.  If this level is greater than 5.3, the patient will be administered Kayexalate therapy 45 g p.o. and the IV dextrose, IV insulin, IV bicarb once again, and the level will be repeated again in 6 hours.  If the patient's potassium level is not decreasing appropriately, the renal physician on-call will be notified.  Dr. Darrick Penna is the consultant and has been consulted for this case.  He will see the patient in the a.m. sooner if needed.  The patient's medications will be further verified and her ACE inhibitor therapy will continue to be discontinued as well as the ARB therapy and magnesium therapy, and the patient will be placed on a renal diet at this time.  IV fluids have also been ordered for rehydration.  This should also help improve her BUN and creatinine and assist with improving her potassium level as well.  DVT prophylaxis has been ordered.  The patient's glucose levels will also be monitored and she will be placed on sliding scale insulin coverage as needed.  The patient is a full code.     Della Goo, M.D.     HJ/MEDQ  D:  11/24/2010  T:  11/24/2010  Job:  [id number]  cc:   Stacie Acres. Cliffton Asters, M.D.  Electronically Signed by Della Goo M.D. on 01/01/2011 07:44:42 PM

## 2011-01-15 LAB — SURGICAL PCR SCREEN: Staphylococcus aureus: NEGATIVE

## 2011-01-15 LAB — GLUCOSE, CAPILLARY
Glucose-Capillary: 121 mg/dL — ABNORMAL HIGH (ref 70–99)
Glucose-Capillary: 151 mg/dL — ABNORMAL HIGH (ref 70–99)

## 2011-01-17 LAB — GLUCOSE, CAPILLARY
Glucose-Capillary: 116 mg/dL — ABNORMAL HIGH (ref 70–99)
Glucose-Capillary: 222 mg/dL — ABNORMAL HIGH (ref 70–99)

## 2011-03-13 NOTE — Assessment & Plan Note (Signed)
[facility]                             PULMONARY OFFICE NOTE   [name], [name]                       MRN:          [id number]  DATE:05/05/2007                            DOB:          09-04-1931    I saw [name] today in followup after she had undergone her overnight  polysomnogram which was done on April 11, 2007.   This showed an overall apnea/popnea index of 5, with an oxygen  saturation rate of 83%, and periodic limb movement index of 25. She only  had about 3 minutes of an oxygen saturation below 91%.   I had reviewed these results with her. She confirms again that she is  sleeping much better since she has lost a significant amount of weight  and that her main complaint right now is just chronic back pain. She  does occasionally get funny feelings in her legs, but this is quite  infrequent and noticeable enough to cause her problems falling asleep or  staying asleep. At this time I have advised her to do the best she can  to maintain her weight, as well as to monitor for any worsening of her  symptoms with regards to difficultly sleeping at night, or excessive  daytime sleepiness, as well as possible symptoms of restless leg  syndrome. Otherwise I do not think that she would need to have any  further therapy for her sleep apnea again because it overall is fairly  mild, and she is doing quite well symptomatically. I have advised her  that she can follow up with me on an as needed basis if her symptoms  worsen, otherwise the remainder of her care should be done by her  primary care physician.     Coralyn Helling, MD  Electronically Signed    VS/MedQ  DD: 05/05/2007  DT: 05/05/2007  Job #: [id number]   cc:   Armanda Magic, M.D.  Stacie Acres Cliffton Asters, M.D.

## 2011-03-13 NOTE — Procedures (Signed)
NAME:  Tara Harmon, Tara Harmon NO.:  [id number]   MEDICAL RECORD NO.:  [id number]          PATIENT TYPE:  OUT   LOCATION:  SLEEP CENTER                 FACILITY:  [facility]   PHYSICIAN:  Coralyn Helling, MD        DATE OF BIRTH:  1931-04-18   DATE OF STUDY:                            NOCTURNAL POLYSOMNOGRAM   REFERRING PHYSICIAN:  Coralyn Helling, MD   INDICATIONS FOR STUDY:  This is an individual who has a history of  obstructive sleep apnea, but has since lost a significant amount of  weight.  She is referred back to the sleep lab for evaluation of  obstructive sleep apnea.   EPWORTH SLEEPINESS SCORE:  Is 4.   MEDICATIONS:  1. Aspirin.  2. Toprol XL.  3. Calcium.  4. Glucophage.  5. Azithromycin.  6. Vytorin.  7. Magnesium.  8. Multivitamin.  9. Fish oil.  10.Colchicine.  11.Prilosec.  12.Hydrochlorothiazide.   SLEEP ARCHITECTURE:  Total recorded time was 442 minutes.  Total sleep  time was 324 minutes.  Sleep efficiency was 73%.  Sleep latency was 27  minutes, which is prolonged.  REM latency was 158 minutes.  The patient  was observed in all stages of sleep.  The patient sleep predominantly in  the non-supine position.   RESPIRATORY DATA:  The average respiratory rate was 18.  The overall  apnea/hypopnea index was 5.  There were four central apneic events.  The remainder of the events were  obstructive in nature.  The supine apnea/hypopnea index was 2.8.  The  non-supine apnea/hypopnea index was 5.  The REM apnea/hypopnea index was  14.  The non-REM apnea/hypopnea index was 2.3.  Moderate snoring was  noted by the technician.   OXYGEN DATA:  The baseline oxygenation was 97%.  The oxygen saturation  Nadir was 83%.  The patient spent a total of 433 minutes with an oxygen  saturation between 91%-100% and three minutes with an oxygen saturation  between 81%-90%.   CARDIAC DATA:  The average heart rate was 63.  The rhythm strip showed a  paced rhythm.   MOVEMENT-PARASOMNIA:  The periodic limb movement index was 25.  The  patient had one bathroom trip.   IMPRESSIONS-RECOMMENDATIONS:  There is evidence for mild obstructive  sleep apnea as demonstrated by an apnea/hypopnea index of 5 and an  oxygen saturation of 83%.  She did have a significant rapid eye movement  effect during sleep apnea.   She also had an increase in her periodic limb movement index and  clinical correlation will be necessary to determine the significance of  this.      Coralyn Helling, MD  Diplomat, American Board of Sleep Medicine  Electronically Signed     VS/MEDQ  D:  04/21/2007 18:13:53  T:  04/22/2007 10:11:53  Job:  [id number]

## 2011-03-16 NOTE — Cardiovascular Report (Signed)
NAMEJASMYNN, PFALZGRAF NO.:  [id number]   MEDICAL RECORD NO.:  [id number]          PATIENT TYPE:  OIB   LOCATION:  2899                         FACILITY:  [facility]   PHYSICIAN:  Meade Maw, M.D.    DATE OF BIRTH:  1931/10/04   DATE OF PROCEDURE:  10/18/2005  DATE OF DISCHARGE:  10/18/2005                              CARDIAC CATHETERIZATION   REFERRING PHYSICIAN:  Stacie Acres. Cliffton Asters, M.D.   INDICATIONS FOR PROCEDURE:  Dyspnea with reversal changes noted on the  inferior wall on Cardiolite.   DESCRIPTION OF PROCEDURE:  After obtaining written informed consent, the  patient was brought to the cardiac catheterization lab in a post absorptive  state.  Preop sedation was achieved using Versed 1 mg IV.  The right groin  was prepped and draped in the usual sterile fashion.  Local anesthesia was  achieved using 1% Xylocaine.  A  6-French hemostasis sheath was placed into  the right femoral artery using the modified Seldinger technique.  Selective  coronary angiography was performed using JL-4, JR-4 Judkins catheter.  Multiple views were obtained.  All catheter exchanges were made over a  guidewire. A single-plane ventriculogram was performed in the RAO position  using a 6-French pigtail curved catheter.  There was no immediate  complication.  The patient was transferred to the holding area.  Hemostasis  was achieved using digital pressure.   FINDINGS:  1.  Aortic aortic pressure was 148/76, LV pressure was 148/13 with an EDP of      20.  2.  Single-plane ventriculogram revealed normal wall motion. There was a      post PVC mitral regurgitation noted only.  3.  Fluoroscopy revealed mild calcification of the left anterior descending.   CORONARY ANGIOGRAPHY:  1.  The left main coronary artery is short, bifurcates into the left      anterior descending and circumflex vessel.  The left anterior descending      gives rise to trivial D1, small D2, moderate D3 and goes on as  an apical      branch.  There are luminal irregularities in the left anterior      descending only.  2.  Circumflex vessel:  The circumflex vessel is a large caliber vessel,      codominant for the posterior circulation, gives rise to a trivial OM-1,      trivial OM-2 and large trifurcating OM-3, a large trifurcating OM-3 and      a large trifurcating posterior lateral branch.  There is no disease      noted in circumflex or its branches.  3.  Right coronary artery is codominant and gives rise to two RV marginals,      a small to moderate PDA and small PL branch.  There is no disease noted      in the right coronary artery or its branches.   FINAL IMPRESSION:  1.  False- positive Cardiolite.  2.  Normal coronaries.  3.  Normal left ventriculogram, ejection fraction of 60-65%   RECOMMENDATIONS:  Consider other etiologies and management for her  dyspnea.      Meade Maw, M.D.  Electronically Signed     HP/MEDQ  D:  10/18/2005  T:  10/20/2005  Job:  [id number]

## 2011-03-16 NOTE — Assessment & Plan Note (Signed)
[facility]                             PULMONARY OFFICE NOTE   MORENA, MCKISSACK                       MRN:          [id number]  DATE:02/26/2007                            DOB:          09-12-1931    I met Ms. Downard today for evaluation of her sleep apnea.   She said that she had a sleep test done several years ago and was  diagnosed with obstructive sleep apnea and had been on CPAP therapy.  I  do not have the results of these sleep tests available for my review at  this time.  She says that she used to weigh 250 pounds, when she was  diagnosed with sleep apnea.  She had suffered an ankle fracture in July  2007, and had a prolonged hospital stay and as a result she had a  decrease in her appetite with resultant significant weight loss of  approximately 70 pounds.  She says that with the weight loss, her sleep  had improved considerably and that she actually stopped using her CPAP  machine.  She says that she has been able to maintain her weight  reduction.   Her current sleep pattern is that she goes to bed between 10 and 11 at  night.  She usually falls asleep fairly quickly.  She wakes up 2-3 times  during the night to use the bathroom.  Although she says that when she  was on CPAP, she noticed that she was not having to wake up quite as  frequently to use the bathroom.  She is able to fall back to sleep  fairly quickly.  And, she wakes up at 5:45 in the morning.  She denies  having any headaches in the morning.  She says she does not have any  difficulty as far as feeling sleepy during the day.  She is not having  any problems as far as feeling sleepy while driving.  She says she used  to wake up with a choking sensation but has not done this since she has  lost weight.  She says that her snoring has also decreased with the  weight loss.  She still has trouble sleeping on her back, although she  believes this is related to back pains.   There is no history of sleep  walking or sleep talking, nightmares, or night terrors.  She denies any  symptoms of restless leg syndrome.  There is no history of sleep  hallucination or sleep paralysis/cataplexy.  She is not currently using  anything to help her fall asleep at night.  She drinks one cup of coffee  in the morning and one large glass of tea in the afternoon.   PAST MEDICAL HISTORY:  Otherwise is significant for:  1. Obesity.  2. Dyspnea.  3. Hypertension.  4. Diastolic dysfunction.  5. Dyslipidemia.  6. Second and third degree AV block, status post pacemaker placement.  7. Basal cell carcinoma of the left hand.  8. Diabetes.  9. Knee replacement in July 2007.  10.Ankle fracture in July 2007.  11.Spinal stenosis.  12.Herniated disk in her lumbar spine.  13.History of gout.   CURRENT MEDICATIONS:  1. Aspirin 81 mg daily.  2. Toprol XL 50 mg daily.  3. Calcium with vitamin D 500 mg daily.  4. Glucophage 500 mg b.i.d.  5. Zestril 40 mg daily.  6. Vytorin 10/40 once daily.  7. Magnesium 400 mg b.i.d.  8. Multivitamin once daily.  9. Fish oil once daily.  10.Colchicine 0.6 mg b.i.d.  11.Prilosec 20 mg as needed.  12.Hydrochlorothiazide 25 mg as needed.   She says that codeine makes her feel nauseous.   SOCIAL HISTORY:  She is married.  She used to work as a Production designer, theatre/television/film for  accounts payable.  There is no history of tobacco or alcohol use.   FAMILY HISTORY:  Significant for father with lung cancer and her mother  had heart disease.   REVIEW OF SYSTEMS:  Essentially unremarkable.   PHYSICAL EXAMINATION:  VITAL SIGNS:  She is 5 feet 10 inches tall, 186  pounds, temperature is 98.5, blood pressure is 128/54, heart rate is 70,  oxygen saturation is 96% on room air.  HEENT:  There is no sinus tenderness.  No nasal discharge.  Pupils  reactive.  She has a Mallampati-2 airway.  No oral lesions.  No  lymphadenopathy.  No thyromegaly.  HEART:  S1 S2.  Regular rate and  rhythm.  CHEST:  Clear to auscultation.  ABDOMEN:  Soft, nontender.  Positive bowel sounds.  EXTREMITIES:  Middle and ankle edema.  There is no cyanosis or clubbing.  NEUROLOGIC:  No focal deficits were appreciated.   IMPRESSION:  History of obstructive sleep apnea with significant weight  loss.  It appears that with the weight loss, her sleep disruption has  improved considerably.  The main concern that I have is that if she  still has some residual degree of sleep apnea, given her history of  hypertension and diabetes, it would be prudent to have her continue on  therapy for her sleep apnea.  To determine if she does have residual  sleep apnea I will arrange for her to undergo a repeat overnight  polysomnogram.  Depending upon the results of this I will make further  recommendations.   I will follow up with her after I have the opportunity to review her  sleep study.     Coralyn Helling, MD  Electronically Signed    VS/MedQ  DD: 02/27/2007  DT: 02/27/2007  Job #: [id number]   cc:   Armanda Magic, M.D.

## 2011-03-16 NOTE — Discharge Summary (Signed)
NAME:  Tara Harmon, Tara Harmon                          ACCOUNT NO.:  [id number]   MEDICAL RECORD NO.:  [id number]                   PATIENT TYPE:  OIB   LOCATION:  3732                                 FACILITY:   PHYSICIAN:  Francisca December, M.D.               DATE OF BIRTH:  1931/04/24   DATE OF ADMISSION:  11/10/2002  DATE OF DISCHARGE:  11/11/2002                                 DISCHARGE SUMMARY   [place] ACCOUNT NUMBER:  [id number]   ADMISSION DIAGNOSES:  1. Second-degree Mobitz type II heart block, symptomatic.  2. Hypertension.  3. Adult onset diabetes mellitus, type 2.  4. Obesity.  5. Hyperlipidemia.   DISCHARGE DIAGNOSES:  1. Status post permanent transvenous pacemaker implantation for symptomatic     second-degree Mobitz type II heart and intermittent third-degree heart     block.  2. Hypertension.  3. Adult onset diabetes mellitus, type 2.  4. Obesity.  5. Hyperlipidemia.   HISTORY OF PRESENT ILLNESS:  The patient is a very pleasant 77 year old  female patient of Dr. Lindell Spar who was found to have intermittent second-  degree Mobitz type II heart block and third-degree heart block associated  with significant fatigue.  This was evaluated by Holter monitor testing and  heart rates were found to drop in the 20s at times.  Dr. Amil Amen had a long  discussion with the patient regarding outcomes with pacemaker therapy and  was not sure if she would have significant improvement as she does have  chronotropic incompetence (able to increase heart rate to 94 beats per  minute during treadmill exercise).  However, the patient  is anxious to  proceed with pacemaker implantation.  The risks and benefits of procedure  have been reviewed.  The patient agrees to proceed.   PROCEDURE:  Transvenous pacemaker implantation by Dr. Amil Amen on 11/10/02.  Guidant Discovery 2-DR model number [id number], serial number [id number] with A lead  Guidant model 4470-52 cm, serial number [id number] and V lead  Guidant model  4459-58 cm, serial number [id number].  Please see patient report for  threshold testing details.   COMPLICATIONS:  None.   CONSULTATIONS:  None.   HOSPITAL COURSE:  The patient was admitted to [facility] on 11/10/02  for elective permanent transvenous pacemaker implantation.  Indication  intermittent 2:1 heart block with rates in the range of 38 to 50 beats per  minute and occasional third-degree heart block.  Dr. Amil Amen successfully  implanted a Guidant Discovery 2-DR dual-chamber pacemaker without  complications.  Please see patient report for details of threshold testing.  The patient tolerated the procedure well.  She was re-interrogated by Bettey Mare with [employer] and all diagnostics were normal.   On 11/11/02, the patient remained stable.  She did have some nausea (took  Vicodin early this morning), but otherwise feels great.  Hemodynamics are  stable.  Telemetry shows paced rhythm.  Pacer site  in left chest wall is  clean, dry and intact.  Steri-Strips are in place.  There is some mild  pocket fullness anteriorly, but no evidence of infection.  Chest x-ray is  pending.   The patient will be deemed stable for discharge to home if Dr. Amil Amen  believes that her pacer site is stable and chest x-ray is satisfactory.   DISCHARGE MEDICATIONS:  1. Zocor 80 mg daily.  2. Zestril 10 mg daily.  3. Furosemide 40 mg daily.  4. Glucotrol XL 10 mg daily.  5. Nexium 40 mg daily.  6. Vicodin one to two every four to six hours as needed for pain.  7. Phenergan 25 mg one p.o. q.i.d. p.r.n.  nausea.   The patient is given a supplemental discharge instruction for pacemaker  patient's which includes activity and wound care instructions.  Tara Harmon  document).   DIET:  Low fat, low salt, low cholesterol, low sugar.   She is to keep Steri-Strips in place.  May shower 11/14/02.   She is asked to call the office if any problems or questions.   FOLLOW UP:  Scheduled  with Dr. Amil Amen for Tuesday 1/27 at 10:15. She will  see Dr. Fraser Din on Friday 2/13 at 10:15.      Georgiann Cocker Jernejcic, P.A.                   Francisca December, M.D.    TCJ/MEDQ  D:  11/11/2002  T:  11/11/2002  Job:  [id number]   cc:   Meade Maw, M.D.  [address]  [place]  [place] [address]  Fax: [phone]   Tama Headings. Marina Goodell, M.D.  [address]  [place]  [place] [address]  Fax: [phone]

## 2011-03-16 NOTE — Op Note (Signed)
NAME:  Tara Harmon, Tara Harmon                          ACCOUNT NO.:  [id number]   MEDICAL RECORD NO.:  [id number]                   PATIENT TYPE:  OIB   LOCATION:  2875                                 FACILITY:  [facility]   PHYSICIAN:  Francisca December, M.D.               DATE OF BIRTH:  26-Dec-1930   DATE OF PROCEDURE:  11/10/2002  DATE OF DISCHARGE:                                 OPERATIVE REPORT   PROCEDURES PERFORMED:  Insertion, dual chamber, permanent transvenous  pacemaker.   INDICATION:  Tara Harmon is a 77 year old woman who presented to Dr. Candace Cruise with complaints of fatigue and dyspnea.  Holter monitoring has  revealed the presence of intermittent 2:1 block with heart rates in the  range of 38 to 50 beats per minute.  She is therefore brought to the Cardiac  Catheterization Laboratory at this time to allow for insertion of a dual  chamber pacemaker.   PROCEDURAL NOTE:  The patient was brought to the Cardiac Catheterization  Laboratory in a post-absorptive state.  The left pre-pectoral region was  prepped and draped in the usual sterile fashion.  Local anesthesia was  obtained with the infiltration of 1% lidocaine.  A 6-to-7-cm incision was  made in the deltopectoral groove, and this was carried down by sharp  dissection and electrocautery to the pre-pectoral fascia.  There a plane was  lifted and a pocket formed inferiorly and medially.  The pocket was then  packed with a 1% kanamycin-soaked gauze.  Two separate left subclavian  punctures were then performed using an 18-gauge thin-wall needle through  which was passed a 0.038-inch tight J guidewire.  Over the initial  guidewire, a 7-French tear-away sheath and dilator were advanced.  The  dilator and wire were removed.  The ventricular lead was advanced to the  level of the right atrium.  The sheath was then torn away.  Using standard  technique and fluoroscopic landmarks, the lead was manipulated into the  right ventricular  apex.  There, excellent pacing parameters were obtained as  will be noted below.  The lead was tested for diaphragmatic pacing at 10  volts and none was found.  The lead was then sutured into place using three  separate 0 silk ligatures.  Over the remaining guidewire, an 8-French tear-  away sheath and dilator was advanced.  The dilator was removed.  The wire  was allowed to remain in place.  The atrial lead was advanced to the level  of the right atrium.  The sheath was torn away.  Again, using standard  technique and fluoroscopic landmarks, the lead was manipulated into the  right atrial appendage.  There, excellent pacing parameters were obtained as  will be noted below.  The lead was tested for diaphragmatic pacing at 10  volts and none was found.  The lead was then sutured into place using three  separate  0 silk ligatures.  The kanamycin-soaked gauze was then removed from  the pocket, and the pocket was copiously irrigated with 1% kanamycin  solution.  The leads were then connected to the pacing generator, carefully  identifying each by its serial number and carefully placing each in the  appropriate receptacle.  The set screws were tightened into place.  The  leads were then wound beneath the pacing generator, and the generator was  placed in the pocket.  The pocket was inspected for bleeding and none was  found.  The pocket was then closed using 2-0 Dexon in a running fashion for  the subcutaneous layer.  The skin was approximated using 5-0 Dexon in a  running subcuticular fashion.  Steri-Strips and a sterile dressing were  applied, and the patient was transported to the recovery area in stable  condition in an A sense, V pace mode.   EQUIPMENT DATA:  The pacing generator is a Architect II DR, model  number [id number], serial number [id number].  The atrial lead is a Guidant model  number [id number], serial number [id number].  The ventricular lead was a Guidant model  number [id number], serial number  [id number].   PACING DATA:  The ventricular lead detected a 10 millivolt R wave.  The  pacing threshold was 0.3 volt at 0.5 msec.  The impedance was 522 ohms.  The  current was 0.6 mA.  The atrial lead detected a 2.9 millivolt P wave.  The  pacing threshold was 0.9 volt at 0.5 msec.  The impedance was 345 ohms, and  the current was 3.3.                                               Francisca December, M.D.    JHE/MEDQ  D:  11/10/2002  T:  11/10/2002  Job:  [id number]   cc:   Meade Maw, M.D.  [address]  [place]  [place] [address]  Fax: [phone]   Tama Headings. Marina Goodell, M.D.  [address]  [place]  [place] [address]  Fax: [phone]   Cardiac Catheterization Laboratory

## 2011-03-16 NOTE — Op Note (Signed)
Tara Harmon, Tara Harmon                ACCOUNT NO.:  [id number]   MEDICAL RECORD NO.:  [id number]          PATIENT TYPE:  AMB   LOCATION:  ENDO                         FACILITY:  [facility]   PHYSICIAN:  Danise Edge, M.D.   DATE OF BIRTH:  1931-01-18   DATE OF PROCEDURE:  10/24/2005  DATE OF DISCHARGE:                                 OPERATIVE REPORT   PROCEDURE:  Esophagogastroduodenoscopy.   REFERRING PHYSICIAN:  Stacie Acres. Cliffton Asters, M.D.   PROCEDURE INDICATIONS:  Ms. Aspyn Warnke is a 77 year old female born [birth year]. Ms. Canaday has iron deficiency anemia based on a serum ferritin  6.2, iron saturation 4%, hemoglobin 9.1 grams, and microcytic MCV. Ms.  Mckendree reports no gastrointestinal bleeding or abdominal pain.   June 2001 proctocolonoscopy to the cecum revealed small left colonic  diverticula; a diminutive adenomatous polyp was removed from the cecum.   February 2004 proctocolonoscopy to the cecum resulted in the removal of  three diminutive tubular adenomatous polyps.   Ms. Tremblay takes Prilosec and a baby aspirin daily. Last week she underwent  a cardiac catheterization which revealed normal coronary arteries and normal  left ventricular ejection fraction.   ENDOSCOPIST:  Danise Edge, M.D.   PREMEDICATION:  Versed 5 mg, Demerol 20 mg.   DESCRIPTION OF PROCEDURE:  After obtaining informed consent, Ms. Strader was  placed in the left lateral decubitus position. I administered intravenous  Demerol and intravenous Versed to achieve conscious sedation for the  procedure. The patient's blood pressure, oxygen saturation and cardiac  rhythm were monitored throughout the procedure and documented in the medical  record.   The Olympus gastroscope was passed through the posterior hypopharynx into  the proximal esophagus without difficulty. The hypopharynx, larynx and vocal  cords appeared normal.   ESOPHAGOSCOPY:  The proximal and mid segments of the esophageal mucosa  appeared normal. There nonobstructive mucosal scarring at the  esophagogastric junction. The esophagogastric junction is noted at 35 cm  from the incisor teeth. There is no endoscopic evidence for the presence of  erosive esophagitis, Barrett's esophagus.   GASTROSCOPY:  Ms. Valeri has an extremely large hiatal hernia. Retroflexed  view of the gastric cardia and fundus was normal. The gastric body, antrum  and pylorus appeared normal.   DUODENOSCOPY:  The duodenal bulb, second portion of duodenum and third  portion of duodenum appeared normal.   SMALL BOWEL BIOPSIES:  Five small bowel biopsies were taken from the second  and third portions of the duodenum to look for signs of celiac sprue.   ASSESSMENT:  Normal esophagogastroduodenoscopy except for the presence of an  extremely large hiatal hernia and mucosal scarring at the esophagogastric  junction. I see no signs of gastrointestinal bleeding. Small bowel biopsies  are pending to rule out celiac disease.   RECOMMENDATIONS:  I would recommend checking Ms. Daphine Deutscher for vitamin B12  deficiency if that has not been done.           ______________________________  Danise Edge, M.D.     MJ/MEDQ  D:  10/24/2005  T:  10/25/2005  Job:  [id number]   cc:   Stacie Acres. Cliffton Asters, M.D.  Fax: [phone]

## 2012-02-27 ENCOUNTER — Encounter (HOSPITAL_COMMUNITY): Payer: Self-pay | Admitting: Pharmacy Technician

## 2012-03-05 DIAGNOSIS — I4891 Unspecified atrial fibrillation: Secondary | ICD-10-CM

## 2012-03-05 HISTORY — DX: Unspecified atrial fibrillation: I48.91

## 2012-03-11 ENCOUNTER — Encounter (HOSPITAL_COMMUNITY): Payer: Self-pay

## 2012-03-11 ENCOUNTER — Encounter (HOSPITAL_COMMUNITY)
Admission: RE | Admit: 2012-03-11 | Discharge: 2012-03-11 | Disposition: A | Discharge status: Home / Self Care | Payer: Medicare Other | Source: Ambulatory Visit | Attending: Orthopedic Surgery | Admitting: Orthopedic Surgery

## 2012-03-11 ENCOUNTER — Encounter (HOSPITAL_COMMUNITY)
Admission: RE | Admit: 2012-03-11 | Discharge: 2012-03-11 | Disposition: A | Discharge status: Home / Self Care | Payer: Medicare Other | Source: Ambulatory Visit | Attending: Anesthesiology | Admitting: Anesthesiology

## 2012-03-11 HISTORY — DX: Sleep apnea, unspecified: G47.30

## 2012-03-11 HISTORY — DX: Headache: R51

## 2012-03-11 HISTORY — DX: Essential (primary) hypertension: I10

## 2012-03-11 HISTORY — DX: Unspecified osteoarthritis, unspecified site: M19.90

## 2012-03-11 HISTORY — DX: Presence of cardiac pacemaker: Z95.0

## 2012-03-11 LAB — CBC
HCT: 38.8 % (ref 36.0–46.0)
MCHC: 32.7 g/dL (ref 30.0–36.0)
Platelets: 253 10*3/uL (ref 150–400)
RDW: 15.7 % — ABNORMAL HIGH (ref 11.5–15.5)

## 2012-03-11 LAB — ABO/RH: ABO/RH(D): O POS

## 2012-03-11 LAB — TYPE AND SCREEN
ABO/RH(D): O POS
Antibody Screen: NEGATIVE

## 2012-03-11 LAB — BASIC METABOLIC PANEL
BUN: 28 mg/dL — ABNORMAL HIGH (ref 6–23)
Calcium: 10.4 mg/dL (ref 8.4–10.5)
Creatinine, Ser: 1.3 mg/dL — ABNORMAL HIGH (ref 0.50–1.10)
GFR calc Af Amer: 44 mL/min — ABNORMAL LOW (ref >90)
GFR calc non Af Amer: 38 mL/min — ABNORMAL LOW (ref >90)

## 2012-03-11 NOTE — Pre-Procedure Instructions (Signed)
20 MARCHETTA NAVRATIL  03/11/2012   Your procedure is scheduled on:  Thursday Mar 13, 2012.  Report to [facility] at 0530 AM.  Call this number if you have problems the morning of surgery: [phone]   Remember:   Do not eat food:After Midnight.  May have clear liquids: up to 4 Hours before arrival until 0130 am.  Clear liquids include soda, tea, black coffee, apple or grape juice, broth.  Take these medicines the morning of surgery with A SIP OF WATER: Gabapentin (Neurontin), Metoprolol (Lopressor), and Tramadol (Ultram) if needed for pain.    Do not wear jewelry, make-up or nail polish.  Do not wear lotions, powders, or perfumes. You may wear deodorant.  Do not shave 48 hours prior to surgery. Men may shave face and neck.  Do not bring valuables to the hospital.  Contacts, dentures or bridgework may not be worn into surgery.  Leave suitcase in the car. After surgery it may be brought to your room.  For patients admitted to the hospital, checkout time is 11:00 AM the day of discharge.   Patients discharged the day of surgery will not be allowed to drive home.  Name and phone number of your driver:   Special Instructions: CHG Shower Use Special Wash: 1/2 bottle night before surgery and 1/2 bottle morning of surgery.   Please read over the following fact sheets that you were given: Pain Booklet, Coughing and Deep Breathing, Blood Transfusion Information, MRSA Information and Surgical Site Infection Prevention

## 2012-03-12 MED ORDER — CEFAZOLIN SODIUM-DEXTROSE 2-3 GM-% IV SOLR
2.0000 g | INTRAVENOUS | Status: AC
  Administered 2012-03-13: 2 g via INTRAVENOUS
  Filled 2012-03-12: qty 50

## 2012-03-12 NOTE — Progress Notes (Signed)
Spoke with Joey with [employer] for pacemaker care.  Joey to be called at [phone] once patient out of surgery for ordered interrogation. Nickolas Madrid

## 2012-03-12 NOTE — Progress Notes (Signed)
Follow up on periop Rx for pacer... Called and left message  For Katie ... Regarding return of  Rx. [phone].Marland Kitchen

## 2012-03-13 ENCOUNTER — Encounter (HOSPITAL_COMMUNITY)
Admission: RE | Disposition: A | Discharge status: Skilled Nursing Facility | Payer: Self-pay | Source: Ambulatory Visit | Attending: Orthopedic Surgery

## 2012-03-13 ENCOUNTER — Encounter (HOSPITAL_COMMUNITY): Payer: Self-pay | Admitting: Anesthesiology

## 2012-03-13 ENCOUNTER — Encounter (HOSPITAL_COMMUNITY): Payer: Self-pay | Admitting: *Deleted

## 2012-03-13 ENCOUNTER — Inpatient Hospital Stay (HOSPITAL_COMMUNITY)
Admission: RE | Admit: 2012-03-13 | Discharge: 2012-03-17 | DRG: 484 | Disposition: A | Discharge status: Skilled Nursing Facility | Payer: Medicare Other | Source: Ambulatory Visit | Attending: Orthopedic Surgery | Admitting: Orthopedic Surgery

## 2012-03-13 ENCOUNTER — Ambulatory Visit (HOSPITAL_COMMUNITY): Payer: Medicare Other | Admitting: Anesthesiology

## 2012-03-13 DIAGNOSIS — Z85828 Personal history of other malignant neoplasm of skin: Secondary | ICD-10-CM

## 2012-03-13 DIAGNOSIS — Z01812 Encounter for preprocedural laboratory examination: Secondary | ICD-10-CM

## 2012-03-13 DIAGNOSIS — Z888 Allergy status to other drugs, medicaments and biological substances status: Secondary | ICD-10-CM

## 2012-03-13 DIAGNOSIS — Z950 Presence of cardiac pacemaker: Secondary | ICD-10-CM

## 2012-03-13 DIAGNOSIS — I129 Hypertensive chronic kidney disease with stage 1 through stage 4 chronic kidney disease, or unspecified chronic kidney disease: Secondary | ICD-10-CM | POA: Diagnosis present

## 2012-03-13 DIAGNOSIS — D509 Iron deficiency anemia, unspecified: Secondary | ICD-10-CM | POA: Diagnosis present

## 2012-03-13 DIAGNOSIS — M19019 Primary osteoarthritis, unspecified shoulder: Principal | ICD-10-CM | POA: Diagnosis present

## 2012-03-13 DIAGNOSIS — Z9849 Cataract extraction status, unspecified eye: Secondary | ICD-10-CM

## 2012-03-13 DIAGNOSIS — IMO0002 Reserved for concepts with insufficient information to code with codable children: Secondary | ICD-10-CM | POA: Diagnosis present

## 2012-03-13 DIAGNOSIS — N189 Chronic kidney disease, unspecified: Secondary | ICD-10-CM | POA: Diagnosis present

## 2012-03-13 DIAGNOSIS — M4120 Other idiopathic scoliosis, site unspecified: Secondary | ICD-10-CM | POA: Diagnosis present

## 2012-03-13 DIAGNOSIS — E119 Type 2 diabetes mellitus without complications: Secondary | ICD-10-CM | POA: Diagnosis present

## 2012-03-13 DIAGNOSIS — Z01810 Encounter for preprocedural cardiovascular examination: Secondary | ICD-10-CM

## 2012-03-13 DIAGNOSIS — M19012 Primary osteoarthritis, left shoulder: Secondary | ICD-10-CM | POA: Diagnosis present

## 2012-03-13 SURGERY — ARTHROPLASTY, SHOULDER, TOTAL
Anesthesia: General | Site: Shoulder | Laterality: Left | Wound class: Clean

## 2012-03-13 MED ORDER — FUROSEMIDE 40 MG PO TABS
40.0000 mg | ORAL_TABLET | Freq: Every day | ORAL | Status: DC
  Administered 2012-03-13 – 2012-03-17 (×4): 40 mg via ORAL
  Filled 2012-03-13 (×5): qty 1

## 2012-03-13 MED ORDER — GLYCOPYRROLATE 0.2 MG/ML IJ SOLN
INTRAMUSCULAR | Status: DC | PRN
  Administered 2012-03-13: .5 mg via INTRAVENOUS

## 2012-03-13 MED ORDER — METOCLOPRAMIDE HCL 5 MG/ML IJ SOLN
5.0000 mg | Freq: Three times a day (TID) | INTRAMUSCULAR | Status: DC | PRN
  Filled 2012-03-13 (×3): qty 2

## 2012-03-13 MED ORDER — CHLORHEXIDINE GLUCONATE 4 % EX LIQD: 60.0000 mL | Freq: Once | CUTANEOUS | Status: DC

## 2012-03-13 MED ORDER — DIPHENHYDRAMINE HCL 12.5 MG/5ML PO ELIX: 12.5000 mg | ORAL_SOLUTION | ORAL | Status: DC | PRN

## 2012-03-13 MED ORDER — LACTATED RINGERS IV SOLN: INTRAVENOUS | Status: DC

## 2012-03-13 MED ORDER — EPHEDRINE SULFATE 50 MG/ML IJ SOLN
INTRAMUSCULAR | Status: DC | PRN
  Administered 2012-03-13 (×2): 10 mg via INTRAVENOUS
  Administered 2012-03-13: 5 mg via INTRAVENOUS
  Administered 2012-03-13: 10 mg via INTRAVENOUS

## 2012-03-13 MED ORDER — METOCLOPRAMIDE HCL 10 MG PO TABS
5.0000 mg | ORAL_TABLET | Freq: Three times a day (TID) | ORAL | Status: DC | PRN
  Administered 2012-03-16 – 2012-03-17 (×3): 10 mg via ORAL
  Filled 2012-03-13 (×2): qty 1

## 2012-03-13 MED ORDER — ACETAMINOPHEN 650 MG RE SUPP: 650.0000 mg | Freq: Four times a day (QID) | RECTAL | Status: DC | PRN

## 2012-03-13 MED ORDER — TRAMADOL HCL 50 MG PO TABS
100.0000 mg | ORAL_TABLET | Freq: Three times a day (TID) | ORAL | Status: DC
  Administered 2012-03-13 – 2012-03-17 (×11): 100 mg via ORAL
  Filled 2012-03-13 (×12): qty 2

## 2012-03-13 MED ORDER — ALLOPURINOL 300 MG PO TABS
500.0000 mg | ORAL_TABLET | Freq: Every day | ORAL | Status: DC
  Administered 2012-03-13 – 2012-03-17 (×5): 500 mg via ORAL
  Filled 2012-03-13 (×5): qty 2

## 2012-03-13 MED ORDER — SIMVASTATIN 40 MG PO TABS
40.0000 mg | ORAL_TABLET | Freq: Every day | ORAL | Status: DC
  Administered 2012-03-13 – 2012-03-16 (×4): 40 mg via ORAL
  Filled 2012-03-13 (×5): qty 1

## 2012-03-13 MED ORDER — DIAZEPAM 2 MG PO TABS: 2.0000 mg | ORAL_TABLET | Freq: Four times a day (QID) | ORAL | Status: DC | PRN

## 2012-03-13 MED ORDER — SODIUM CHLORIDE 0.9 % IV SOLN
10.0000 mg | INTRAVENOUS | Status: DC | PRN
  Administered 2012-03-13: 10 ug/min via INTRAVENOUS

## 2012-03-13 MED ORDER — PHENYLEPHRINE HCL 10 MG/ML IJ SOLN
INTRAMUSCULAR | Status: DC | PRN
  Administered 2012-03-13 (×4): 80 ug via INTRAVENOUS

## 2012-03-13 MED ORDER — ROCURONIUM BROMIDE 100 MG/10ML IV SOLN
INTRAVENOUS | Status: DC | PRN
  Administered 2012-03-13: 50 mg via INTRAVENOUS

## 2012-03-13 MED ORDER — PHENOL 1.4 % MT LIQD: 1.0000 | OROMUCOSAL | Status: DC | PRN

## 2012-03-13 MED ORDER — LACTATED RINGERS IV SOLN
INTRAVENOUS | Status: DC | PRN
  Administered 2012-03-13 (×2): via INTRAVENOUS

## 2012-03-13 MED ORDER — HYDROMORPHONE HCL PF 1 MG/ML IJ SOLN: 0.5000 mg | INTRAMUSCULAR | Status: DC | PRN

## 2012-03-13 MED ORDER — MENTHOL 3 MG MT LOZG: 1.0000 | LOZENGE | OROMUCOSAL | Status: DC | PRN

## 2012-03-13 MED ORDER — ONDANSETRON HCL 4 MG/2ML IJ SOLN
4.0000 mg | Freq: Four times a day (QID) | INTRAMUSCULAR | Status: DC | PRN
  Administered 2012-03-14: 4 mg via INTRAVENOUS
  Filled 2012-03-13: qty 2

## 2012-03-13 MED ORDER — DROPERIDOL 2.5 MG/ML IJ SOLN: 0.6250 mg | INTRAMUSCULAR | Status: DC | PRN

## 2012-03-13 MED ORDER — PROPOFOL 10 MG/ML IV EMUL
INTRAVENOUS | Status: DC | PRN
  Administered 2012-03-13: 130 mg via INTRAVENOUS

## 2012-03-13 MED ORDER — ONDANSETRON HCL 4 MG/2ML IJ SOLN
INTRAMUSCULAR | Status: DC | PRN
  Administered 2012-03-13: 4 mg via INTRAVENOUS

## 2012-03-13 MED ORDER — ACETAMINOPHEN 325 MG PO TABS: 650.0000 mg | ORAL_TABLET | Freq: Four times a day (QID) | ORAL | Status: DC | PRN

## 2012-03-13 MED ORDER — NEOSTIGMINE METHYLSULFATE 1 MG/ML IJ SOLN
INTRAMUSCULAR | Status: DC | PRN
  Administered 2012-03-13: 2.5 mg via INTRAVENOUS

## 2012-03-13 MED ORDER — ONDANSETRON HCL 4 MG PO TABS: 4.0000 mg | ORAL_TABLET | Freq: Four times a day (QID) | ORAL | Status: DC | PRN

## 2012-03-13 MED ORDER — MIDAZOLAM HCL 5 MG/5ML IJ SOLN
INTRAMUSCULAR | Status: DC | PRN
  Administered 2012-03-13: 1 mg via INTRAVENOUS

## 2012-03-13 MED ORDER — ASPIRIN EC 81 MG PO TBEC
81.0000 mg | DELAYED_RELEASE_TABLET | Freq: Every day | ORAL | Status: DC
  Administered 2012-03-13 – 2012-03-17 (×5): 81 mg via ORAL
  Filled 2012-03-13 (×5): qty 1

## 2012-03-13 MED ORDER — VITAMIN D (ERGOCALCIFEROL) 1.25 MG (50000 UNIT) PO CAPS
50000.0000 [IU] | ORAL_CAPSULE | ORAL | Status: DC
  Administered 2012-03-16: 50000 [IU] via ORAL
  Filled 2012-03-13: qty 1

## 2012-03-13 MED ORDER — MAGNESIUM HYDROXIDE 400 MG/5ML PO SUSP: 30.0000 mL | Freq: Every day | ORAL | Status: DC | PRN

## 2012-03-13 MED ORDER — LACTATED RINGERS IV SOLN
INTRAVENOUS | Status: DC
  Administered 2012-03-14 – 2012-03-16 (×3): via INTRAVENOUS

## 2012-03-13 MED ORDER — FENTANYL CITRATE 0.05 MG/ML IJ SOLN
INTRAMUSCULAR | Status: DC | PRN
  Administered 2012-03-13: 100 ug via INTRAVENOUS

## 2012-03-13 MED ORDER — METOPROLOL TARTRATE 50 MG PO TABS
75.0000 mg | ORAL_TABLET | Freq: Two times a day (BID) | ORAL | Status: DC
  Administered 2012-03-13 – 2012-03-17 (×7): 75 mg via ORAL
  Filled 2012-03-13 (×9): qty 1

## 2012-03-13 MED ORDER — GABAPENTIN 300 MG PO CAPS
300.0000 mg | ORAL_CAPSULE | Freq: Three times a day (TID) | ORAL | Status: DC
  Administered 2012-03-13 – 2012-03-17 (×11): 300 mg via ORAL
  Filled 2012-03-13 (×14): qty 1

## 2012-03-13 MED ORDER — DOCUSATE SODIUM 100 MG PO CAPS
100.0000 mg | ORAL_CAPSULE | Freq: Two times a day (BID) | ORAL | Status: DC
  Administered 2012-03-13 – 2012-03-17 (×8): 100 mg via ORAL
  Filled 2012-03-13 (×9): qty 1

## 2012-03-13 MED ORDER — BUPIVACAINE-EPINEPHRINE PF 0.5-1:200000 % IJ SOLN
INTRAMUSCULAR | Status: DC | PRN
  Administered 2012-03-13: 150 mg

## 2012-03-13 MED ORDER — SODIUM CHLORIDE 0.9 % IR SOLN
Status: DC | PRN
  Administered 2012-03-13: 1000 mL

## 2012-03-13 MED ORDER — CEFAZOLIN SODIUM 1-5 GM-% IV SOLN
1.0000 g | Freq: Four times a day (QID) | INTRAVENOUS | Status: AC
  Administered 2012-03-13 – 2012-03-14 (×3): 1 g via INTRAVENOUS
  Filled 2012-03-13 (×3): qty 50

## 2012-03-13 MED ORDER — HYDROMORPHONE HCL PF 1 MG/ML IJ SOLN: 0.2500 mg | INTRAMUSCULAR | Status: DC | PRN

## 2012-03-13 MED ORDER — OXYCODONE-ACETAMINOPHEN 5-325 MG PO TABS
1.0000 | ORAL_TABLET | ORAL | Status: DC | PRN
  Administered 2012-03-14: 2 via ORAL
  Administered 2012-03-17: 1 via ORAL
  Filled 2012-03-13: qty 2
  Filled 2012-03-13: qty 1

## 2012-03-13 SURGICAL SUPPLY — 61 items
BLADE SAW SGTL 83.5X18.5 (BLADE) ×2 IMPLANT
CEMENT BONE DEPUY (Cement) ×2 IMPLANT
CLOTH BEACON ORANGE TIMEOUT ST (SAFETY) ×2 IMPLANT
COVER SURGICAL LIGHT HANDLE (MISCELLANEOUS) ×2 IMPLANT
DRAPE INCISE IOBAN 66X45 STRL (DRAPES) ×2 IMPLANT
DRAPE SURG 17X11 SM STRL (DRAPES) ×2 IMPLANT
DRAPE SURG 17X23 STRL (DRAPES) ×2 IMPLANT
DRAPE U-SHAPE 47X51 STRL (DRAPES) ×2 IMPLANT
DRILL BIT 7/64X5 (BIT) IMPLANT
DRSG MEPILEX BORDER 4X8 (GAUZE/BANDAGES/DRESSINGS) IMPLANT
DURAPREP 26ML APPLICATOR (WOUND CARE) ×2 IMPLANT
ELECT BLADE 4.0 EZ CLEAN MEGAD (MISCELLANEOUS) ×2
ELECT CAUTERY BLADE 6.4 (BLADE) ×2 IMPLANT
ELECT REM PT RETURN 9FT ADLT (ELECTROSURGICAL) ×2
ELECTRODE BLDE 4.0 EZ CLN MEGD (MISCELLANEOUS) ×1 IMPLANT
ELECTRODE REM PT RTRN 9FT ADLT (ELECTROSURGICAL) ×1 IMPLANT
FACESHIELD LNG OPTICON STERILE (SAFETY) ×6 IMPLANT
GLOVE BIO SURGEON STRL SZ7.5 (GLOVE) ×2 IMPLANT
GLOVE BIO SURGEON STRL SZ8 (GLOVE) ×2 IMPLANT
GLOVE BIO SURGEON STRL SZ8.5 (GLOVE) ×2 IMPLANT
GLOVE BIOGEL PI IND STRL 6.5 (GLOVE) ×1 IMPLANT
GLOVE BIOGEL PI INDICATOR 6.5 (GLOVE) ×1
GLOVE EUDERMIC 7 POWDERFREE (GLOVE) ×2 IMPLANT
GLOVE SS BIOGEL STRL SZ 6.5 (GLOVE) ×1 IMPLANT
GLOVE SS BIOGEL STRL SZ 7.5 (GLOVE) ×1 IMPLANT
GLOVE SUPERSENSE BIOGEL SZ 6.5 (GLOVE) ×1
GLOVE SUPERSENSE BIOGEL SZ 7.5 (GLOVE) ×1
GLOVE SURG SS PI 8.5 STRL IVOR (GLOVE) ×1
GLOVE SURG SS PI 8.5 STRL STRW (GLOVE) ×1 IMPLANT
GOWN STRL NON-REIN LRG LVL3 (GOWN DISPOSABLE) ×2 IMPLANT
GOWN STRL REIN XL XLG (GOWN DISPOSABLE) ×6 IMPLANT
KIT BASIN OR (CUSTOM PROCEDURE TRAY) ×2 IMPLANT
KIT ROOM TURNOVER OR (KITS) ×2 IMPLANT
MANIFOLD NEPTUNE II (INSTRUMENTS) ×2 IMPLANT
NDL SUT 6 .5 CRC .975X.05 MAYO (NEEDLE) ×1 IMPLANT
NEEDLE HYPO 25GX1X1/2 BEV (NEEDLE) IMPLANT
NEEDLE MAYO TAPER (NEEDLE) ×1
NS IRRIG 1000ML POUR BTL (IV SOLUTION) ×2 IMPLANT
PACK SHOULDER (CUSTOM PROCEDURE TRAY) ×2 IMPLANT
PAD ARMBOARD 7.5X6 YLW CONV (MISCELLANEOUS) ×4 IMPLANT
PASSER SUT SWANSON 36MM LOOP (INSTRUMENTS) ×2 IMPLANT
SLING ARM IMMOBILIZER LRG (SOFTGOODS) IMPLANT
SMARTMIX MINI TOWER (MISCELLANEOUS) ×2
SPONGE LAP 18X18 X RAY DECT (DISPOSABLE) IMPLANT
SPONGE LAP 4X18 X RAY DECT (DISPOSABLE) ×2 IMPLANT
STRIP CLOSURE SKIN 1/2X4 (GAUZE/BANDAGES/DRESSINGS) IMPLANT
SUCTION FRAZIER TIP 10 FR DISP (SUCTIONS) ×2 IMPLANT
SUT BONE WAX W31G (SUTURE) IMPLANT
SUT FIBERWIRE #2 38 T-5 BLUE (SUTURE) ×6
SUT MNCRL AB 3-0 PS2 18 (SUTURE) ×2 IMPLANT
SUT VIC AB 1 CT1 27 (SUTURE) ×2
SUT VIC AB 1 CT1 27XBRD ANBCTR (SUTURE) ×2 IMPLANT
SUT VIC AB 2-0 CT1 27 (SUTURE) ×2
SUT VIC AB 2-0 CT1 TAPERPNT 27 (SUTURE) ×2 IMPLANT
SUTURE FIBERWR #2 38 T-5 BLUE (SUTURE) ×3 IMPLANT
SYR 30ML SLIP (SYRINGE) IMPLANT
SYR CONTROL 10ML LL (SYRINGE) IMPLANT
TOWEL OR 17X24 6PK STRL BLUE (TOWEL DISPOSABLE) ×2 IMPLANT
TOWEL OR 17X26 10 PK STRL BLUE (TOWEL DISPOSABLE) ×2 IMPLANT
TOWER SMARTMIX MINI (MISCELLANEOUS) ×1 IMPLANT
WATER STERILE IRR 1000ML POUR (IV SOLUTION) ×2 IMPLANT

## 2012-03-13 NOTE — Op Note (Signed)
03/13/2012  9:16 AM  PATIENT:   Tara Harmon  78 y.o. female  PRE-OPERATIVE DIAGNOSIS:  OA LEFT SHOULDER  POST-OPERATIVE DIAGNOSIS:  same  PROCEDURE:  L TSA 48 glenoid, 12 stem, 44x21 eccentric head  SURGEON:  Luca Dyar, Vania Rea M.D.  ASSISTANTS: Shuford pac   ANESTHESIA:   GET + ISB  EBL: 200  SPECIMEN:  none  Drains: none   PATIENT DISPOSITION:  PACU - hemodynamically stable.    PLAN OF CARE: Admit to inpatient   Dictation# [id number]

## 2012-03-13 NOTE — Anesthesia Postprocedure Evaluation (Signed)
Anesthesia Post Note  Patient: Tara Harmon  Procedure(s) Performed: Procedure(s) (LRB): TOTAL SHOULDER ARTHROPLASTY (Left)  Anesthesia type: general  Patient location: PACU  Post pain: Pain level controlled  Post assessment: Patient's Cardiovascular Status Stable  Last Vitals:  Filed Vitals:   03/13/12 1030  BP: 167/65  Pulse: 64  Temp: 36.1 C  Resp: 18    Post vital signs: Reviewed and stable  Level of consciousness: sedated  Complications: No apparent anesthesia complications

## 2012-03-13 NOTE — Preoperative (Signed)
Beta Blockers   Reason not to administer Beta Blockers:Not Applicable 

## 2012-03-13 NOTE — H&P (Signed)
Tara Harmon    Chief Complaint: OA LEFT SHOULDER HPI: The patient is a 78 y.o. female with end stage Left shoulder arthrosis  Past Medical History  Diagnosis Date  . Pacemaker     [phone]  DR EDMUNDS  . Dysrhythmia     AFIB 03/05/12  AT PPM CHECK   . Hypertension   . Sleep apnea     NO PROBLEMS NOW PER PATIENT , DOES NOT HAVE MACHINE  . Diabetes mellitus   . Anemia     IRON DEF  . Headache   . Cancer     HX SKIN CANCER     . Arthritis     OSTEO, DDD, SCOLIOSIS, SPURS LUMBAR   . Chronic kidney disease     FOLLOWED BY DR Aram Beecham WHITE(NO HD)    Past Surgical History  Procedure Date  . Insert / replace / remove pacemaker   . Ankle fracture surgery     RIGHT   04/2006  . Joint replacement     2007  LT KNEE   . Back surgery     DR COHEN  3.5 YRS AGO   . Hemorrhoid surgery     YRS AGO   . Cataract extraction w/ intraocular lens  implant, bilateral     History reviewed. No pertinent family history.  Social History:  reports that she has never smoked. She does not have any smokeless tobacco history on file. She reports that she does not drink alcohol or use illicit drugs.  Allergies:  Allergies  Allergen Reactions  . Actos (Pioglitazone) Other (See Comments)    Constipation  . Erythromycin Nausea And Vomiting  . Lisinopril Other (See Comments)    Hyperkalimia  . Losartan Other (See Comments)    Hyperkalimia    Medications Prior to Admission  Medication Sig Dispense Refill  . acetaminophen (TYLENOL) 325 MG tablet Take 650 mg by mouth 2 (two) times daily.      Marland Kitchen alendronate (FOSAMAX) 70 MG tablet Take 70 mg by mouth every 7 (seven) days. Sundays, with a full glass of water on an empty stomach.      Marland Kitchen allopurinol (ZYLOPRIM) 100 MG tablet Take 500 mg by mouth daily.      Marland Kitchen aspirin EC 81 MG tablet Take 81 mg by mouth daily.      . furosemide (LASIX) 40 MG tablet Take 40 mg by mouth daily.      Marland Kitchen gabapentin (NEURONTIN) 300 MG capsule Take 300 mg by mouth 3 (three)  times daily.      . magnesium oxide (MAG-OX) 400 MG tablet Take 400 mg by mouth daily.      . metoprolol (LOPRESSOR) 50 MG tablet Take 75 mg by mouth 2 (two) times daily.       . simvastatin (ZOCOR) 40 MG tablet Take 40 mg by mouth every evening.      . traMADol (ULTRAM) 50 MG tablet Take 100 mg by mouth 3 (three) times daily.      . Vitamin D, Ergocalciferol, (DRISDOL) 50000 UNITS CAPS Take 50,000 Units by mouth every 7 (seven) days. Sundays         Physical Exam: Left shoulder with painful restricted motion as noted at 12/14/11 office visit  Vitals  Temp:  [98.3 F (36.8 C)] 98.3 F (36.8 C) (05/16 0559) Pulse Rate:  [60] 60  (05/16 0559) Resp:  [18] 18  (05/16 0559) BP: (149)/(67) 149/67 mmHg (05/16 0559) SpO2:  [96 %] 96 % (05/16  0559)  Assessment/Plan  Impression: OA LEFT SHOULDER  Plan of Action: Procedure(s): TOTAL SHOULDER ARTHROPLASTY  Janessa Mickle M 03/13/2012, 7:30 AM

## 2012-03-13 NOTE — Progress Notes (Signed)
Utilization review completed.  

## 2012-03-13 NOTE — Discharge Instructions (Signed)
   Kevin M. Supple, M.D., F.A.A.O.S. Orthopaedic Surgery Specializing in Arthroscopic and Reconstructive Surgery of the Shoulder and Knee [phone] [address] - [place], [place] [address] - Fax [phone]   POST-OP TOTAL SHOULDER REPLACEMENT/SHOULDER HEMIARTHROPLASTY INSTRUCTIONS  1. Call the office at [phone] to schedule your first post-op appointment 10-14 days from the date of your surgery.  2. Leave the steri-strips in place over your incisions when performing dressing changes and showering. You may remove your dressings and begin showering on day 5 from surgery. You can expect drainage that is bloody or yellow in nature that should gradually decrease from day of surgery. Change your dressing daily until drainage is completely resolved, then you may feel free to go without a bandage. You can also expect significant bruising around your shoulder that will drift down your arm and into your chest wall. This is very normal and should resolve over several days.  3. Wear your sling/immobilizer at all times except to perform the exercises below or to occasionally let your arm dangle by your side to stretch your elbow. You also need to sleep in your sling immobilizer until instructed otherwise.  4. Range of motion to your elbow, wrist, and hand are encouraged 3-5 times daily. Exercise to your hand and fingers helps to reduce swelling you may experience.  5. Utilize ice to the shoulder 3-5 times minimum a day and additionally if you are experiencing pain.  6. Prescriptions for a pain medication and a muscle relaxant are provided for you. It is recommended that if you are experiencing pain that you pain medication alone is not controlling, add the muscle relaxant along with the pain medication which can give additional pain relief. The first 1-2 days is generally the most severe of your pain and then should gradually decrease. As your pain lessens it is recommended that you  decrease your use of the pain medications to an "as needed basis'" only and to always comply with the recommended dosages of the pain medications.  7. Pain medications can produce constipation along with their use. If you experience this, the use of an over the counter stool softener or laxative daily is recommended.   8. For most patients, if insurance allows, home health services to include therapy has been arranged.  9. For additional questions or concerns, please do not hesitate to call the office. If after hours there is an answering service to forward your concerns to the physician on call.  POST-OP EXERCISES  Pendulum Exercises  Perform pendulum exercises while standing and bending at the waist. Support your uninvolved arm on a table or chair and allow your operated arm to hang freely. Make sure to do these exercises passively - not using you shoulder muscles.  Repeat 20 times. Do 3 sessions per day.      

## 2012-03-13 NOTE — Op Note (Signed)
NAMESOFFIA, DOSHIER NO.:  [id number]  MEDICAL RECORD NO.:  [id number]  LOCATION:  5034                         FACILITY:  [facility]  PHYSICIAN:  Vania Rea. Juma Oxley, M.D.  DATE OF BIRTH:  1931/08/20  DATE OF PROCEDURE:  03/13/2012 DATE OF DISCHARGE:                              OPERATIVE REPORT   PREOPERATIVE DIAGNOSIS:  End-stage left shoulder osteoarthritis.  POSTOPERATIVE DIAGNOSIS:  End-stage left shoulder osteoarthritis.  PROCEDURE:  Left total shoulder arthroplasty utilizing a cemented size 48 glenoid, a press-fit size 12 DePuy global stem with a 44 x 21 eccentric head.  SURGEON:  Vania Rea. Husain Costabile, M.D.  Threasa HeadsFrench Ana A. Shuford, PA-C.  ANESTHESIA:  General endotracheal as well as an interscalene block.  ESTIMATED BLOOD LOSS:  100 mL.  DRAINS:  None.  HISTORY:  Ms. Heather is a 78 year old female who has had chronic and progressive increasing bilateral shoulder pain related to end-stage osteoarthritis of left shoulder, more probable than the right.  She is brought to the operating room at this time for planned left total shoulder arthroplasty.  Preoperatively, I counseled Ms. Obrien on treatment options as well as risks versus benefits thereof.  Possible surgical complications were reviewed including potential for bleeding, infection, neurovascular injury, persistent pain, loss of motion, anesthetic complication, failure of the implant, possible need for additional surgery.  She understands and accepts and agrees with our planned procedure.  PROCEDURE IN DETAIL:  After undergoing routine preop evaluation, the patient received prophylactic antibiotics.  An interscalene block was established in holding area by the Anesthesia Department.  Placed supine on op table, underwent smooth induction of a general endotracheal anesthesia.  Placed into beach-chair position and appropriate padding protected.  Left shoulder girdle region was then sterilely  prepped and draped in standard fashion.  Time-out was called.  An anterior approach to the left shoulder was made through the deltopectoral interval, incision approximately 12 cm in length, beginning at the coracoid, extending laterally and distally.  Skin flaps were elevated. Electrocautery was used for hemostasis.  The cephalic vein was identified and retracted laterally with the deltoid.  Interval was developed bluntly.  The upper centimeter of the pec major was tenotomized for enhanced visualization.  A large collection of fluid was removed from the subdeltoid bursa, consistent with chronic bursitis. Bursectomy was then performed with adhesions divided beneath the deltoid and self-retaining retractor was then placed.  Conjoined tendon was identified, mobilized and retracted medially.  The bicipital groove was opened.  Biceps was tenotomized for later tenodesis.  We then split the rotator interval to the base of the coracoid and then the subscapularis was divided from the lesser tuberosity leaving a distal cuff of tissue approximately centimeter and half for later repair.  The free margin of the subscap was then tagged with a series of #2 FiberWire sutures.  It was then retracted medially.  Dissection then carried along the inferior aspect of the humeral head, exposing very large osteophytes and then subperiosteal dissection, releasing the capsule back pass the 6 o'clock position around to 8 o'clock posteriorly.  At this point, the oscillating saw was then used to perform a humeral head resection after the  proper angle had been marked using the extramedullary cutting guide. We took care to protect the rotator cuff and the head was then removed. At this point, I then removed a very large osteophytes, which had developed on the anterior and inferior margins of the humeral head, using a rongeur.  Sequential reaming was then performed of the humeral canal up to size 12.  We then used the  cookie cutter broach at 30 degrees of retroversion matching the native retroversion.  We then performed broaching up to size 12.  At this point, a metal cap was placed on the cut surface of the proximal humerus and we exposed the glenoid with combination of Fukuda, pitchfork, and snake tongue retractors.  I did divide the capsule anteriorly and then mobilized the subscapularis to enhance visualization.  A circumferential labral resection as well as removal of the stump of the biceps was then performed with electrocautery and complete visualization of the glenoid was achieved.  The size 48 showed the most appropriate fit on the glenoid surface and a central guide pin was placed.  The 48 reamer was then used and then we placed the central drill hole, followed by the peg holes using the appropriate guide.  The glenoid was irrigated.  Trial glenoid showed excellent fit.  At this point, cement was then mixed at appropriate consistency.  After the peg holes had been appropriately cleaned and dried, we introduced cement into the peripheral peg holes. The final 48 glenoid was then impacted into position.  Cement was then allowed to harden.  We then returned our attention to the proximal humerus where the humeral stem was then implanted and we did use bone graft harvested from the resected humeral head and performed bone grafting about the metaphyseal region and excellent fit and fixation was achieved and again maintaining approximately 30 degrees of retroversion of the implant.  We then performed multiple trial reductions with various size heads and ultimately, we found that the 44 x 21 head eccentric allowed the best combination of coverage of the proximal humerus and soft tissue balance.  The [facility] taper was then cleaned and dried.  The final 44 x 21 eccentric head was impacted into position. Final reduction was performed, much to our satisfaction.  We then performed a repair of the  subscapularis back to the soft tissue cuff at the lesser tuberosity and also closed the rotator interval with a figure- of-eight #2 FiberWire suture.  Biceps tendon was then tenodesed the bicipital groove with #2 FiberWire.  Wound was then irrigated. Hemostasis was obtained.  Shoulder taken through range of motion showing good soft tissue balance and easily 30-degree of external rotation.  The deltopectoral interval was then reapproximated with 0 Vicryl sutures, 2- 0 Vicryl for the deep subcu layer and intracuticular 3-0 Monocryl for the skin, followed by Steri-Strips.  Dry dressings were then applied over the left shoulder.  Left arm was placed in a sling.  The patient was awakened, extubated, and taken to the recovery room in stable condition.     Vania Rea. Judaea Burgoon, M.D.     KMS/MEDQ  D:  03/13/2012  T:  03/13/2012  Job:  [id number]

## 2012-03-13 NOTE — Anesthesia Procedure Notes (Addendum)
Anesthesia Regional Block:  Interscalene brachial plexus block  Pre-Anesthetic Checklist: ,, timeout performed, Correct Patient, Correct Site, Correct Laterality, Correct Procedure,, site marked, risks and benefits discussed, Surgical consent,  Pre-op evaluation,  At surgeon's request and post-op pain management  Laterality: Right  Prep: chloraprep       Needles:  Injection technique: Single-shot  Needle Type: Echogenic Stimulator Needle     Needle Length: 5cm 5 cm Needle Gauge: 22 and 22 G    Additional Needles:  Procedures: ultrasound guided and nerve stimulator Interscalene brachial plexus block  Nerve Stimulator or Paresthesia:  Response: bicep contraction, 0.45 mA,   Additional Responses:   Narrative:  Start time: 03/13/2012 7:03 AM End time: 03/13/2012 7:16 AM Injection made incrementally with aspirations every 5 mL.  Performed by: Personally  Anesthesiologist: J. Adonis Huguenin, MD  Additional Notes: Functioning IV was confirmed and monitors applied.  A 50mm 22ga echogenic arrow stimulator was used. Sterile prep and drape,hand hygiene and sterile gloves were used.Ultrasound guidance: relevant anatomy identified, needle position confirmed, local anesthetic spread visualized around nerve(s)., vascular puncture avoided.  Image printed for medical record.  Negative aspiration and negative test dose prior to incremental administration of local anesthetic. The patient tolerated the procedure well.  Interscalene brachial plexus block Procedure Name: Intubation Date/Time: 03/13/2012 7:35 AM Performed by: Sharlene Dory E Pre-anesthesia Checklist: Patient identified, Emergency Drugs available, Suction available, Patient being monitored and Timeout performed Patient Re-evaluated:Patient Re-evaluated prior to inductionOxygen Delivery Method: Circle system utilized Preoxygenation: Pre-oxygenation with 100% oxygen Intubation Type: IV induction Ventilation: Mask ventilation without  difficulty Laryngoscope Size: Mac and 3 Grade View: Grade I Tube type: Oral Tube size: 7.0 mm Number of attempts: 1 Airway Equipment and Method: Stylet Placement Confirmation: ETT inserted through vocal cords under direct vision,  positive ETCO2 and breath sounds checked- equal and bilateral Secured at: 21 cm Tube secured with: Tape Dental Injury: Teeth and Oropharynx as per pre-operative assessment

## 2012-03-13 NOTE — Plan of Care (Signed)
Problem: Consults Goal: Diagnosis- Total Joint Replacement Hemiarthroplasty Total Shoulder

## 2012-03-13 NOTE — Anesthesia Preprocedure Evaluation (Addendum)
Anesthesia Evaluation  Patient identified by MRN, date of birth, ID band Patient awake    Reviewed: Allergy & Precautions, H&P , NPO status , Patient's Chart, lab work & pertinent test results, reviewed documented beta blocker date and time   History of Anesthesia Complications Negative for: history of anesthetic complications  Airway Mallampati: II TM Distance: >3 FB Neck ROM: Full    Dental  (+) Missing and Dental Advisory Given   Pulmonary sleep apnea ,  breath sounds clear to auscultation  Pulmonary exam normal       Cardiovascular hypertension, Pt. on medications and Pt. on home beta blockers + dysrhythmias Atrial Fibrillation + pacemaker Rhythm:Regular Rate:Normal     Neuro/Psych  Headaches,    GI/Hepatic negative GI ROS, Neg liver ROS,   Endo/Other  Diabetes mellitus-, Well Controlled  Renal/GU Renal InsufficiencyRenal disease     Musculoskeletal   Abdominal   Peds  Hematology   Anesthesia Other Findings   Reproductive/Obstetrics                          Anesthesia Physical Anesthesia Plan  ASA: III  Anesthesia Plan: General   Post-op Pain Management:    Induction: Intravenous  Airway Management Planned: Oral ETT  Additional Equipment:   Intra-op Plan:   Post-operative Plan: Extubation in OR  Informed Consent: I have reviewed the patients History and Physical, chart, labs and discussed the procedure including the risks, benefits and alternatives for the proposed anesthesia with the patient or authorized representative who has indicated his/her understanding and acceptance.   Dental advisory given  Plan Discussed with: CRNA, Anesthesiologist and Surgeon  Anesthesia Plan Comments:         Anesthesia Quick Evaluation

## 2012-03-13 NOTE — Transfer of Care (Signed)
Immediate Anesthesia Transfer of Care Note  Patient: Tara Harmon  Procedure(s) Performed: Procedure(s) (LRB): TOTAL SHOULDER ARTHROPLASTY (Left)  Patient Location: PACU  Anesthesia Type: General  Level of Consciousness: awake, alert  and oriented  Airway & Oxygen Therapy: Patient Spontanous Breathing and Patient connected to nasal cannula oxygen  Post-op Assessment: Report given to PACU RN, Post -op Vital signs reviewed and stable and Patient moving all extremities X 4  Post vital signs: Reviewed and stable  Complications: No apparent anesthesia complications

## 2012-03-14 ENCOUNTER — Encounter (HOSPITAL_COMMUNITY): Payer: Self-pay | Admitting: Orthopedic Surgery

## 2012-03-14 LAB — GLUCOSE, CAPILLARY
Glucose-Capillary: 136 mg/dL — ABNORMAL HIGH (ref 70–99)
Glucose-Capillary: 139 mg/dL — ABNORMAL HIGH (ref 70–99)
Glucose-Capillary: 152 mg/dL — ABNORMAL HIGH (ref 70–99)
Glucose-Capillary: 174 mg/dL — ABNORMAL HIGH (ref 70–99)
Glucose-Capillary: 123 mg/dL — ABNORMAL HIGH (ref 70–99)

## 2012-03-14 MED ORDER — METOCLOPRAMIDE HCL 5 MG/ML IJ SOLN
10.0000 mg | Freq: Four times a day (QID) | INTRAMUSCULAR | Status: DC
  Administered 2012-03-14 – 2012-03-16 (×5): 10 mg via INTRAVENOUS
  Filled 2012-03-14 (×12): qty 2

## 2012-03-14 MED ORDER — LACTATED RINGERS IV BOLUS (SEPSIS)
250.0000 mL | Freq: Once | INTRAVENOUS | Status: AC
  Administered 2012-03-14: 250 mL via INTRAVENOUS

## 2012-03-14 NOTE — Progress Notes (Signed)
Tara Harmon  MRN: [id number] DOB/Age: [birth year] 78 y.o. Physician: Jacquelyne Balint Procedure: Procedure(s) (LRB): TOTAL SHOULDER ARTHROPLASTY (Left)     Subjective: Very nauseated today and yesterday even before block wore off so likely from anesthesia. Pt also is a minimal ambulator and typically uses walker due to chronic Back problems. She has multiple ortho devices and support and desires dc to home but will need home health etc.  Vital Signs Temp:  [96.9 F (36.1 C)-99.9 F (37.7 C)] 99.4 F (37.4 C) (05/17 0556) Pulse Rate:  [64-103] 71  (05/17 0556) Resp:  [14-22] 20  (05/17 0556) BP: (103-167)/(46-102) 112/47 mmHg (05/17 0556) SpO2:  [93 %-100 %] 98 % (05/17 0556)  Lab Results  Basename 03/11/12 1102  WBC 9.8  HGB 12.7  HCT 38.8  PLT 253   BMET  Basename 03/11/12 1102  NA 139  K 4.9  CL 98  CO2 30  GLUCOSE 121*  BUN 28*  CREATININE 1.30*  CALCIUM 10.4   No results found for this basename: inr     Exam Nauseated NVI to LUE, moderate bruising        Plan IV fluid bolus and reglan ordered. Will see how does with OT today and plan on possible DC tomorrow if doing better. Will need help at home and need to confirm she will be safe for home vs [facility] however she prefers home.  [name] for Dr.Kevin Supple 03/14/2012, 7:39 AM

## 2012-03-14 NOTE — Progress Notes (Signed)
Physical Therapy Evaluation Note  Past Medical History  Diagnosis Date  . Pacemaker     [phone]  DR EDMUNDS  . Dysrhythmia     AFIB 03/05/12  AT PPM CHECK   . Hypertension   . Sleep apnea     NO PROBLEMS NOW PER PATIENT , DOES NOT HAVE MACHINE  . Diabetes mellitus   . Anemia     IRON DEF  . Headache   . Cancer     HX SKIN CANCER     . Arthritis     OSTEO, DDD, SCOLIOSIS, SPURS LUMBAR   . Chronic kidney disease     FOLLOWED BY DR Aram Beecham WHITE(NO HD)    Past Surgical History  Procedure Date  . Insert / replace / remove pacemaker   . Ankle fracture surgery     RIGHT   04/2006  . Joint replacement     2007  LT KNEE   . Back surgery     DR COHEN  3.5 YRS AGO   . Hemorrhoid surgery     YRS AGO   . Cataract extraction w/ intraocular lens  implant, bilateral      03/14/12 1147  PT Visit Information  Last PT Received On 03/14/12  Assistance Needed +2  PT/OT Co-Evaluation/Treatment Yes  PT Time Calculation  PT Start Time 1147  PT Stop Time 1227  PT Time Calculation (min) 40 min  Subjective Data  Subjective Pt received sitting EOB with OT.  Precautions  Precautions Shoulder  Type of Shoulder Precautions 30, ER, 60 ABD, 90 flexion PROM  Required Braces or Orthoses (sling for L UE)  Restrictions  Weight Bearing Restrictions Yes  RUE Weight Bearing NWB  Home Living  Lives With Spouse  Available Help at Discharge Family;Friend(s)  Type of Home House  Home Access Ramped entrance  Home Layout One level  Bathroom Shower/Tub Walk-in Pension scheme manager Yes  How Accessible Accessible via wheelchair  Home Adaptive Equipment Bedside commode/3-in-1;Walker - rolling;Shower chair with back  Additional Comments Pt's husband has medical issues and unable to provide a lot of assist. pt's daughter lives next door and works during the day only able to help with cleaning and at night.   Prior Function  Level of Independence Independent with  assistive device(s)  Able to Take Stairs? No  Driving Yes  Vocation Retired  Comments Pt was using RW PTA and responsible for driving granddaughter to work MWF.  Communication  Communication No difficulties  Cognition  Overall Cognitive Status Appears within functional limits for tasks assessed/performed  Arousal/Alertness Awake/alert  Orientation Level Appears intact for tasks assessed  Behavior During Session [facility] for tasks performed  Right Upper Extremity Assessment  RUE ROM/Strength/Tone Deficits  RUE ROM/Strength/Tone Deficits pt with severe arthritis and limited shoulder ROM and functional mobility and limited ability to push self up  Left Upper Extremity Assessment  LUE ROM/Strength/Tone Due to precautions;Deficits (see OT assessment)  Right Lower Extremity Assessment  RLE ROM/Strength/Tone Deficits (ROM WFL, strength 3/5)  Left Lower Extremity Assessment  LLE ROM/Strength/Tone Deficits;Due to pain  LLE ROM/Strength/Tone Deficits ROM WFL however limited WBing tolerance due to onset of pain  Trunk Assessment  Trunk Assessment Kyphotic (increased trunk flexion due to back pain)  Bed Mobility  Bed Mobility Not assessed (pt received sitting EOB)  Transfers  Transfers Sit to Stand;Stand to Sit  Sit to Stand 2: Max assist;With upper extremity assist;From bed  Stand to Sit 2: Max assist;With  armrests;To chair/3-in-1  Details for Transfer Assistance pt unable to push self up with R UE requiring increased assist, unsteady upon initial stand.  Ambulation/Gait  Ambulation/Gait Assistance 1: +2 Total assist  Ambulation/Gait: Patient Percentage 60%  Ambulation Distance (Feet) 15 Feet  Assistive device Hemi-walker (on R)  Ambulation/Gait Assistance Details maxA for R hemiwalker management, max assist to maintain upright posture due to increased trunk flexion, patient with L LE knee buckling x 3 requriing maxA to sit in chair safely.  Gait Pattern Step-to pattern;Decreased step  length - right;Decreased step length - left;Decreased stance time - right;Decreased stance time - left;Decreased stride length;Antalgic  Gait velocity slow  General Gait Details pt severely unsteady  Stairs No  Balance  Balance Assessed Yes  Static Standing Balance  Static Standing - Balance Support Right upper extremity supported  Static Standing - Level of Assistance 2: Max assist  Static Standing - Comment/# of Minutes pt unable to maintain balance I'ly  PT - End of Session  Equipment Utilized During Treatment Gait belt (L UE sling)  Activity Tolerance Patient limited by fatigue;Patient limited by pain  Patient left in chair;with call bell/phone within reach;with family/visitor present  Nurse Communication Mobility status  PT Assessment  Clinical Impression Statement Pt s/p L shoulder replacement presenting with severely limited bilat UE function. Patient unsafe to return home due to inability to ambulate, transfer and perform ADLs without maxA. Patient to strongly benefit from SNF placement to achieve maximal functional recovery for safe transition home.  PT Recommendation/Assessment Patient needs continued PT services  PT Problem List Decreased strength;Decreased range of motion;Decreased activity tolerance;Decreased balance  Barriers to Discharge Decreased caregiver support  PT Therapy Diagnosis  Difficulty walking;Abnormality of gait;Generalized weakness;Acute pain  PT Plan  PT Frequency Min 5X/week  PT Treatment/Interventions DME instruction;Gait training;Stair training;Functional mobility training;Therapeutic activities;Therapeutic exercise  PT Recommendation  Follow Up Recommendations Skilled nursing facility;Supervision/Assistance - 24 hour  Equipment Recommended Defer to next venue  Individuals Consulted  Consulted and Agree with Results and Recommendations Patient  Acute Rehab PT Goals  PT Goal Formulation With patient  Time For Goal Achievement 03/28/12  Potential to  Achieve Goals Good  Pt will go Supine/Side to Sit with supervision;with HOB 0 degrees  PT Goal: Supine/Side to Sit - Progress Goal set today  Pt will go Sit to Stand with supervision;with upper extremity assist  PT Goal: Sit to Stand - Progress Goal set today  Pt will Transfer Bed to Chair/Chair to Bed with supervision (with hemi-walker.)  PT Transfer Goal: Bed to Chair/Chair to Bed - Progress Goal set today  Pt will Ambulate 16 - 50 feet;with supervision (with R hemi-walker.)  PT Goal: Ambulate - Progress Goal set today  Written Expression  Dominant Hand Right    Pain: pt unable to rate but it reports L shoulder to be tolerable.  Lewis Shock, PT, DPT Pager #: [phone] Office #: [phone]

## 2012-03-14 NOTE — Evaluation (Signed)
Occupational Therapy Evaluation Patient Details Name: Tara Harmon MRN: [id number] DOB: April 23, 1931 Today's Date: 03/14/2012 Time: 4098-1191 OT Time Calculation (min): 53 min  *Please see shadow chart for full evaluation*  OT Assessment / Plan / Recommendation Clinical Impression   Pt. Presents s/p left TSA and with increased pain thus affecting pt's PLOF. Pt. With rt. Shoulder limitations and bil LE decreased function due to back pain. Pt. Initially declining ST-SNF, however after completing mobility in the room requiring assist of 2 pt. Aware of need to improve to decrease fall risk and be able to care for her husband at D/C home. Recommend ST-SNF to increase pt's safety and overall functional status.        Follow Up Recommendations    ST-SNF (pt. Aware of recommendation)   Barriers to Discharge  Decreased caregiver support available             Precautions / Restrictions Precautions Precautions: Shoulder Type of Shoulder Precautions: 30,60,90; PROM  Restrictions Weight Bearing Restrictions: Yes   Pertinent Vitals/Pain 6/10 left shoulder with exercises    ADL         OT Goals Acute Rehab OT Goals OT Goal Formulation: With patient Time For Goal Achievement: 03/21/12 Potential to Achieve Goals: Good ADL Goals Pt Will Perform Grooming: with set-up;with supervision;Standing at sink ADL Goal: Grooming - Progress: Goal set today Pt Will Perform Upper Body Bathing: with min assist;Sitting at sink ADL Goal: Upper Body Bathing - Progress: Goal set today Pt Will Perform Upper Body Dressing: with set-up;Sitting, bed ADL Goal: Upper Body Dressing - Progress: Goal set today Pt Will Transfer to Toilet: with min assist;Ambulation;3-in-1;with DME;Maintaining weight bearing status ADL Goal: Toilet Transfer - Progress: Goal set today Additional ADL Goal #1: Pt. will recall ADL techniques with left UE precautions ADL Goal: Additional Goal #1 - Progress: Goal set today Arm Goals Pt  Will Perform AROM: with supervision, verbal cues required/provided;Left upper extremity;2 sets;10 reps Arm Goal: AROM - Progress: Goal set today Additional Arm Goal #1: Pt. will complete pendulum exercises with min assist and min verbal cues Arm Goal: Additional Goal #1 - Progress: Goal set today  Visit Information  Last OT Received On: 03/14/12 Assistance Needed: +2    Subjective Data  Subjective: "I don't want rehab, but I will go if I really need to"   Prior Functioning  Home Living Lives With: Spouse Available Help at Discharge: Family;Friend(s) Type of Home: House Home Access: Ramped entrance Home Layout: One level Bathroom Shower/Tub: Health visitor: Standard Bathroom Accessibility: Yes How Accessible: Accessible via wheelchair Home Adaptive Equipment: Bedside commode/3-in-1;Walker - rolling;Shower chair with back Additional Comments: Pt's husband has medically issues and unable to provide a lot of assist. pt's daughter lives next door and works during the day only able to help with cleaning and at night.  Prior Function Level of Independence: Independent with assistive device(s) Able to Take Stairs?: No Driving: Yes Vocation: Retired Comments: Pt. was using RW PTA and now not an option with shoulder  Communication Communication: No difficulties Dominant Hand: Right                   End of Session  Pt. In chair with granddaughter present and call bell in reach.   Marin Milley, OTR/L Pager [phone] 03/14/2012, 2:04 PM

## 2012-03-15 LAB — GLUCOSE, CAPILLARY
Glucose-Capillary: 126 mg/dL — ABNORMAL HIGH (ref 70–99)
Glucose-Capillary: 143 mg/dL — ABNORMAL HIGH (ref 70–99)
Glucose-Capillary: 101 mg/dL — ABNORMAL HIGH (ref 70–99)

## 2012-03-15 NOTE — Progress Notes (Signed)
Tara Harmon  MRN: [id number] DOB/Age: 02/17/1931 78 y.o. Physician: Lynnea Maizes, M.D. 2 Days Post-Op Procedure(s) (LRB): TOTAL SHOULDER ARTHROPLASTY (Left)  Subjective: Slept well, reports minimal left shoulder pain. Right shoulder painful. Vital Signs Temp:  [98.9 F (37.2 C)-100.1 F (37.8 C)] 99.3 F (37.4 C) (05/18 0619) Pulse Rate:  [68-89] 85  (05/18 0619) Resp:  [16-20] 18  (05/18 0619) BP: (106-134)/(49-68) 134/68 mmHg (05/18 0619) SpO2:  [90 %-96 %] 90 % (05/18 0619)  Lab Results No results found for this basename: WBC:2,HGB:2,HCT:2,PLT:2 in the last 72 hours BMET No results found for this basename: NA:2,K:2,CL:2,CO2:2,GLUCOSE:2,BUN:2,CREATININE:2,CALCIUM:2 in the last 72 hours No results found for this basename: inr     Exam  Left shoulder incision clean and dry, N/V intact LUE  Plan Patient requiring max assist with transfers, will need STSNF. Discussed with patient and she is agreeable Paulino Cork M 03/15/2012, 9:15 AM

## 2012-03-15 NOTE — Progress Notes (Signed)
Physical Therapy Treatment Note   03/15/12 0935  PT Visit Information  Last PT Received On 03/15/12  Assistance Needed +2  PT Time Calculation  PT Start Time 0935  PT Stop Time 0954  PT Time Calculation (min) 19 min  Subjective Data  Subjective Pt received supine in bed agreeable to PT.  Precautions  Precautions Shoulder  Type of Shoulder Precautions 30,60,90; PROM   Restrictions  RUE Weight Bearing NWB  Cognition  Overall Cognitive Status Appears within functional limits for tasks assessed/performed  Arousal/Alertness Awake/alert  Orientation Level Appears intact for tasks assessed  Behavior During Session [facility] for tasks performed  Bed Mobility  Bed Mobility Supine to Sit;Sitting - Scoot to Edge of Bed  Supine to Sit 3: Mod assist;HOB flat  Sitting - Scoot to Edge of Bed 3: Mod assist  Details for Bed Mobility Assistance pt able to move bilat LEs off EOB, assist for elevation of trunk  Transfers  Transfers Sit to Stand;Stand to Sit  Sit to Stand 3: Mod assist;From bed  Stand to Sit 3: Mod assist;To chair/3-in-1  Details for Transfer Assistance assist up until half way point  Ambulation/Gait  Ambulation/Gait Assistance 1: +2 Total assist  Ambulation/Gait: Patient Percentage 70%  Ambulation Distance (Feet) 15 Feet  Assistive device Hemi-walker  Ambulation/Gait Assistance Details maxA on L side, improved management of R hemi-walker, max directional verbal cues  Gait Pattern Step-to pattern;Decreased step length - right;Decreased step length - left;Decreased stance time - right;Decreased stance time - left;Decreased stride length;Antalgic  Gait velocity slow  General Gait Details pt much improved from yesterday  PT - End of Session  Equipment Utilized During Treatment Gait belt (L UE in sling)  Activity Tolerance Patient limited by fatigue;Patient limited by pain  Patient left in chair;with call bell/phone within reach (L UE elevated on pillows)  Nurse Communication  Mobility status  PT - Assessment/Plan  Comments on Treatment Session Patient with improvement and demonstrated progression towards all goals. patient remains extremely debilitated requiring assist for all mobitly and ADLs.  PT Plan Discharge plan remains appropriate;Frequency remains appropriate  PT Frequency Min 5X/week  Follow Up Recommendations Skilled nursing facility;Supervision/Assistance - 24 hour  Equipment Recommended Defer to next venue  Acute Rehab PT Goals  PT Goal: Supine/Side to Sit - Progress Progressing toward goal  PT Goal: Sit to Stand - Progress Progressing toward goal  PT Goal: Ambulate - Progress Progressing toward goal     Pain: pt unable to rate but reports L UE pain to be "more than usual."  Lewis Shock, PT, DPT Pager #: [phone] Office #: [phone]

## 2012-03-15 NOTE — Progress Notes (Signed)
Occupational Therapy Treatment Patient Details Name: Tara Harmon MRN: [id number] DOB: [birth year] Today's Date: 03/15/2012 Time: 4098-1191 OT Time Calculation (min): 14 min  OT Assessment / Plan / Recommendation Comments on Treatment Session Pt. able to tolerate left UE assisted pendulum exercises (due to decreased balance with standing) with decreased pain today and agreeable to ST-SNF.    Follow Up Recommendations  Skilled nursing facility       Equipment Recommendations  Defer to next venue       Frequency Min 2X/week   Plan Discharge plan remains appropriate    Precautions / Restrictions Precautions Precautions: Shoulder Type of Shoulder Precautions: 30,60,90; PROM  Required Braces or Orthoses:  (sling Left UE) Restrictions Weight Bearing Restrictions: Yes  LUE Weight Bearing: Non weight bearing   Pertinent Vitals/Pain "Tolerable" left shoulder with exercises    ADL         OT Goals Acute Rehab OT Goals OT Goal Formulation: With patient Time For Goal Achievement: 03/21/12 Potential to Achieve Goals: Good ADL Goals Additional ADL Goal #1: Pt. will recall ADL techniques with left UE precautions ADL Goal: Additional Goal #1 - Progress: Progressing toward goals Arm Goals Pt Will Perform AROM: with supervision, verbal cues required/provided;Left upper extremity;2 sets;10 reps Arm Goal: AROM - Progress: Progressing toward goal Additional Arm Goal #1: Pt. will complete pendulum exercises with min assist and min verbal cues Arm Goal: Additional Goal #1 - Progress: Progressing toward goals  Visit Information  Last OT Received On: 03/15/12 Assistance Needed: +2          Cognition  Overall Cognitive Status: Appears within functional limits for tasks assessed/performed Arousal/Alertness: Awake/alert Orientation Level: Appears intact for tasks assessed       Exercises Shoulder Exercises Pendulum Exercise: AAROM;Seated;10 reps;Left;Other (comment)  (clockwise/counter/horizontal/forward/ support of UE AAROM) Shoulder Flexion: PROM;Left;10 reps;Seated Shoulder ABduction: PROM;Left;10 reps;Seated Wrist Flexion: AROM;Left;15 reps;Seated Wrist Extension: AROM;15 reps;Seated;Left Digit Composite Flexion: AROM;Left;15 reps;Seated Composite Extension: AROM;Left;15 reps;Seated Neck Flexion: AROM;10 reps;Seated Neck Lateral Flexion - Right: AROM;10 reps;Seated Neck Lateral Flexion - Left: AROM;10 reps;Seated     End of Session  Pt. In Chair with call bell   Cassandria Anger, OTR/L Pager [phone] 03/15/2012, 11:52 AM

## 2012-03-16 LAB — GLUCOSE, CAPILLARY
Glucose-Capillary: 94 mg/dL (ref 70–99)
Glucose-Capillary: 120 mg/dL — ABNORMAL HIGH (ref 70–99)

## 2012-03-16 NOTE — Progress Notes (Signed)
Physical Therapy Treatment Patient Details Name: Tara Harmon MRN: [id number] DOB: 1931-09-10 Today's Date: 03/16/2012 Time: 4098-1191 PT Time Calculation (min): 24 min  PT Assessment / Plan / Recommendation Comments on Treatment Session  Pt making slow but steady progress with mobility & PT goals.  Would continue to benefit from ST-SNF to maximize functional recovery, independence, strength, & safety.     Follow Up Recommendations  Skilled nursing facility;Supervision/Assistance - 24 hour    Barriers to Discharge        Equipment Recommendations  Defer to next venue    Recommendations for Other Services    Frequency Min 5X/week   Plan Discharge plan remains appropriate;Frequency remains appropriate    Precautions / Restrictions Precautions Precautions: Shoulder Type of Shoulder Precautions: 30,60,90; PROM  Required Braces or Orthoses: Other Brace/Splint (Sling Lt UE) Restrictions Weight Bearing Restrictions: Yes LUE Weight Bearing: Non weight bearing       Mobility  Bed Mobility Bed Mobility: Supine to Sit;Sitting - Scoot to Edge of Bed Supine to Sit: 4: Min assist;HOB flat Sitting - Scoot to [place] of Bed: 4: Min guard Details for Bed Mobility Assistance: Minor (A) provided at shoulders/trunk to sit upright.  No (A) needed to scoot to EOB today compared to Last PT session.  Transfers Transfers: Sit to Stand;Stand to Sit Sit to Stand: 3: Mod assist;With upper extremity assist;From bed Stand to Sit: 4: Min assist;With upper extremity assist;To chair/3-in-1;With armrests Details for Transfer Assistance: (A) to achieve standing, balance, upright posture, controlled descent, & safety.  Pt continues to have difficulty pushing self up with RUE.   Ambulation/Gait Ambulation/Gait Assistance: 3: Mod assist Ambulation Distance (Feet): 25 Feet Assistive device: Hemi-walker Ambulation/Gait Assistance Details: (A) for balance, upright posture, & safety.  Pt able to manage AD without  physical (A) but required max cueing for sequencing & placement of hemiwalker.   Gait Pattern: Step-to pattern;Decreased step length - right;Decreased step length - left;Trunk flexed Stairs: No    Exercises     PT Diagnosis:    PT Problem List:   PT Treatment Interventions:     PT Goals Acute Rehab PT Goals PT Goal: Supine/Side to Sit - Progress: Progressing toward goal PT Goal: Sit to Stand - Progress: Not met PT Goal: Ambulate - Progress: Progressing toward goal  Visit Information  Last PT Received On: 03/16/12 Assistance Needed: +1    Subjective Data      Cognition  Overall Cognitive Status: Appears within functional limits for tasks assessed/performed Arousal/Alertness: Awake/alert Orientation Level: Appears intact for tasks assessed Behavior During Session: [facility] for tasks performed    Balance  Balance Balance Assessed: No  End of Session PT - End of Session Equipment Utilized During Treatment: Gait belt;Other (comment) (Rt UE sling) Activity Tolerance: Patient tolerated treatment well Patient left: in chair;with call bell/phone within reach    Lara Mulch 03/16/2012, 11:07 AM   Verdell Face, PTA [phone] 03/16/2012

## 2012-03-16 NOTE — Progress Notes (Addendum)
Clinical Social Work Department CLINICAL SOCIAL WORK PLACEMENT NOTE 03/16/2012  Patient:  KAITLYN, FRANKO  Account Number:  [id number] Admit date:  03/13/2012  Clinical Social Worker:  Skip Mayer  Date/time:  03/16/2012 11:30 AM  Clinical Social Work is seeking post-discharge placement for this patient at the following level of care:   SKILLED NURSING   (*CSW will update this form in Epic as items are completed)   (JB)03/15/2012  Patient/family provided with [facility] Department of Clinical Social Work's list of facilities offering this level of care within the geographic area requested by the patient (or if unable, by the patient's family).  (JB)03/15/2012  Patient/family informed of their freedom to choose among providers that offer the needed level of care, that participate in Medicare, Medicaid or managed care program needed by the patient, have an available bed and are willing to accept the patient.  (JB)03/15/2012  Patient/family informed of [facility]' ownership interest in [facility], as well as of the fact that they are under no obligation to receive care at this facility.  PASARR submitted to [employer] on 03/16/2012 (JB) PASARR number received from [employer] on 03/16/12  FL2 transmitted to all facilities in geographic area requested by pt/family on  03/16/2012 (JB) FL2 transmitted to all facilities within larger geographic area on   Patient informed that his/her managed care company has contracts with or will negotiate with  certain facilities, including the following:     Patient/family informed of bed offers received:  03/17/12 Patient chooses bed at [facility] Physician recommends and patient chooses bed at    Patient to be transferred to [facility] on  03/17/12 Patient to be transferred to facility by [facility]  The following physician request were entered in Epic:   Additional Comments:  Dellie Burns, MSW, LCSWA [phone] (weekend)

## 2012-03-16 NOTE — Progress Notes (Signed)
Clinical Social Work Department BRIEF PSYCHOSOCIAL ASSESSMENT 03/16/2012  Patient:  Tara Harmon, Tara Harmon     Account Number:  [id number]     Admit date:  03/13/2012  Clinical Social Worker:  Skip Mayer  Date/Time:  03/15/2012 03:00 PM  Referred by:  Physician  Date Referred:  03/14/2012 Referred for  SNF Placement   Other Referral:   Interview type:  Patient Other interview type:    PSYCHOSOCIAL DATA Living Status:  FAMILY Admitted from facility:   Level of care:   Primary support name:  Darl Pikes and [name] Primary support relationship to patient:  CHILD, ADULT Degree of support available:   Adequate, per pt    CURRENT CONCERNS Current Concerns  Post-Acute Placement   Other Concerns:    SOCIAL WORK ASSESSMENT / PLAN CSW met with pt re: PT/OT recommendation for ST SNF. Pt reports aware of recommendation and is agreeable as her husband has health issues and is unable to provide adequate assit at home. Pt also reports support from dtrs/grand-dtrs. CSW explained placement process with [employer], including copays, and answered questions. Pt hopeful for [facility]. FL2 completed and SNF serach initiated. Weekday CSW to f/u with offers and assist with d/c.   Assessment/plan status:  Information/Referral to [facility] Other assessment/ plan:   Information/referral to community resources:   SNF    PATIENT'S/FAMILY'S RESPONSE TO PLAN OF CARE: Pt reports agreeable to ST SNF in order to increase strength and independence with mobility prior to return home. Pt verbalized understanding of placement process and appreciation for CSW assist/visit.        Dellie Burns, MSW, [place] [phone] (weekend)

## 2012-03-16 NOTE — Progress Notes (Signed)
Subjective: Procedure(s) (LRB): TOTAL SHOULDER ARTHROPLASTY (Left) 3 Days Post-Op  Patient reports pain as moderate.  Reports unchanged shoulder pain Positive void Negative bowel movement Positive flatus Negative chest pain or shortness of breath  Objective: Vital signs in last 24 hours: Temp:  [98.9 F (37.2 C)-100 F (37.8 C)] 99 F (37.2 C) (05/19 0505) Pulse Rate:  [76-86] 76  (05/19 0505) Resp:  [14-16] 16  (05/19 0505) BP: (121-147)/(64-74) 133/74 mmHg (05/19 0505) SpO2:  [91 %-92 %] 91 % (05/19 0505) Weight:  [78.019 kg (172 lb)] 78.019 kg (172 lb) (05/18 1700)  Intake/Output from previous day: 05/18 0701 - 05/19 0700 In: 1918.8 [P.O.:660; I.V.:1258.8] Out: -   No results found for this basename: WBC:2,RBC:2,HCT:2,PLT:2 in the last 72 hours No results found for this basename: NA:2,K:2,CL:2,CO2:2,BUN:2,CREATININE:2,GLUCOSE:2,CALCIUM:2 in the last 72 hours No results found for this basename: LABPT:2,INR:2 in the last 72 hours  Neurologically intact Neurovascular intact Intact pulses distally Incision: dressing C/D/I  Assessment/Plan: Patient stable  Waiting SNF Continue care   Dr. Shon Baton evaluated the patient this morning  Gwinda Maine 03/16/2012, 9:36 AM

## 2012-03-17 LAB — GLUCOSE, CAPILLARY: Glucose-Capillary: 90 mg/dL (ref 70–99)

## 2012-03-17 MED ORDER — DIAZEPAM 2 MG PO TABS: 2.0000 mg | ORAL_TABLET | Freq: Four times a day (QID) | ORAL | Status: AC | PRN

## 2012-03-17 MED ORDER — TRAMADOL HCL 50 MG PO TABS: 100.0000 mg | ORAL_TABLET | Freq: Three times a day (TID) | ORAL | Status: AC

## 2012-03-17 MED ORDER — OXYCODONE-ACETAMINOPHEN 5-325 MG PO TABS: 1.0000 | ORAL_TABLET | ORAL | Status: AC | PRN

## 2012-03-17 NOTE — Discharge Summary (Signed)
  Subjective: 4 Days Post-Op Procedure(s) (LRB): TOTAL SHOULDER ARTHROPLASTY (Left) Patient reports pain as mild.   Patient is ready to go to skilled when bed available  Objective: Vital signs in last 24 hours: Temp:  [98.3 F (36.8 C)-99.2 F (37.3 C)] 98.3 F (36.8 C) (05/20 0607) Pulse Rate:  [68-74] 68  (05/20 0607) Resp:  [16] 16  (05/20 0607) BP: (117-153)/(72-75) 125/73 mmHg (05/20 0607) SpO2:  [92 %-99 %] 92 % (05/20 0607)  Intake/Output from previous day:  Intake/Output Summary (Last 24 hours) at 03/17/12 1214 Last data filed at 03/17/12 0900  Gross per 24 hour  Intake    960 ml  Output    150 ml  Net    810 ml    Intake/Output this shift: Total I/O In: 240 [P.O.:240] Out: 150 [Urine:150]  Labs: No results found for this basename: HGB:5 in the last 72 hours No results found for this basename: WBC:2,RBC:2,HCT:2,PLT:2 in the last 72 hours No results found for this basename: NA:2,K:2,CL:2,CO2:2,BUN:2,CREATININE:2,GLUCOSE:2,CALCIUM:2 in the last 72 hours No results found for this basename: LABPT:2,INR:2 in the last 72 hours  EXAM: General - Patient is Alert Extremity - Intact pulses distally Incision - no drainage Motor Function - intact, moving fingers well on exam.   Assessment/Plan: 4 Days Post-Op Procedure(s) (LRB): TOTAL SHOULDER ARTHROPLASTY (Left) Procedure(s) (LRB): TOTAL SHOULDER ARTHROPLASTY (Left) Past Medical History  Diagnosis Date  . Pacemaker     [phone]  DR EDMUNDS  . Dysrhythmia     AFIB 03/05/12  AT PPM CHECK   . Hypertension   . Sleep apnea     NO PROBLEMS NOW PER PATIENT , DOES NOT HAVE MACHINE  . Diabetes mellitus   . Anemia     IRON DEF  . Headache   . Cancer     HX SKIN CANCER     . Arthritis     OSTEO, DDD, SCOLIOSIS, SPURS LUMBAR   . Chronic kidney disease     FOLLOWED BY DR Aram Beecham WHITE(NO HD)   Active Problems:  Arthritis of shoulder region, degenerative, left   Discharge to SNF Diet - Regular diet Follow up -  in 7-10 days Activity - pt is to work with PT/OT per TSA protocol. PROM to left shoulder 30 ER,60,ABD,90 FF. Ok to move elbow wrist and hands and perform pendulum exercises Disposition - Skilled nursing facility Condition Upon Discharge - Good D/C Meds - See DC Summary DVT Prophylaxis - None  Jakyria Bleau 03/17/2012, 12:14 PM

## 2012-03-17 NOTE — Progress Notes (Signed)
Occupational Therapy Treatment Patient Details Name: Tara Harmon MRN: [id number] DOB: 05-28-1931 Today's Date: 03/17/2012 Time: 4098-1191 OT Time Calculation (min): 30 min  OT Assessment / Plan / Recommendation Comments on Treatment Session Good partidipation with exercise program . Reviewed the difference between A/PROM. Written information given for D/C. Pt plans to D/C to SNF today for rehab.    Follow Up Recommendations  Skilled nursing facility    Barriers to Discharge       Equipment Recommendations  Defer to next venue    Recommendations for Other Services    Frequency Min 2X/week   Plan Discharge plan remains appropriate    Precautions / Restrictions Precautions Precautions: Shoulder Type of Shoulder Precautions: 30,60,90; PROM  Precaution Booklet Issued: Yes (comment) Required Braces or Orthoses: Other Brace/Splint Restrictions Weight Bearing Restrictions: Yes LUE Weight Bearing: Non weight bearing   Pertinent Vitals/Pain 8. nsg gave pain meds    ADL       OT Diagnosis:    OT Problem List:   OT Treatment Interventions:     OT Goals Acute Rehab OT Goals OT Goal Formulation: With patient Time For Goal Achievement: 03/21/12 Potential to Achieve Goals: Good ADL Goals Pt Will Perform Grooming: with set-up;with supervision;Standing at sink ADL Goal: Grooming - Progress: Progressing toward goals Pt Will Perform Upper Body Bathing: with min assist;Sitting at sink ADL Goal: Upper Body Bathing - Progress: Progressing toward goals Pt Will Perform Upper Body Dressing: with set-up;Sitting, bed ADL Goal: Upper Body Dressing - Progress: Progressing toward goals Pt Will Transfer to Toilet: with min assist;Ambulation;3-in-1;with DME;Maintaining weight bearing status ADL Goal: Toilet Transfer - Progress: Progressing toward goals Additional ADL Goal #1: Pt. will recall ADL techniques with left UE precautions ADL Goal: Additional Goal #1 - Progress: Met Arm Goals Pt  Will Perform AROM: with supervision, verbal cues required/provided;Left upper extremity;2 sets;10 reps Arm Goal: AROM - Progress: Progressing toward goal Additional Arm Goal #1: Pt. will complete pendulum exercises with min assist and min verbal cues Arm Goal: Additional Goal #1 - Progress: Met  Visit Information  Last OT Received On: 03/17/12    Subjective Data   I want to go to [facility]   Prior Functioning    indep   Cognition  Overall Cognitive Status: Appears within functional limits for tasks assessed/performed    Mobility     Exercises Shoulder Exercises Pendulum Exercise: AAROM;Seated;10 reps;Left;Other (comment) Shoulder Flexion: PROM;Left;10 reps;Seated Shoulder ABduction: PROM;Left;10 reps;Seated Shoulder External Rotation: PROM;10 reps;Seated Elbow Flexion: AROM;10 reps;Seated;Left Elbow Extension: AROM;Left;10 reps;Seated Neck Flexion: AROM;10 reps;Seated Neck Extension: AROM;10 reps;Seated Neck Lateral Flexion - Right: AROM;10 reps;Seated Neck Lateral Flexion - Left: AROM;10 reps;Seated  Balance    End of Session  left in chair. Call bell within reach.   Edy Mcbane,HILLARY 03/17/2012, 10:33 AM Luisa Dago, OTR/L  [phone] 03/17/2012

## 2012-03-17 NOTE — Progress Notes (Signed)
Physical Therapy Treatment Patient Details Name: Tara Harmon MRN: [id number] DOB: [birth year] Today's Date: 03/17/2012 Time: 4098-1191 PT Time Calculation (min): 17 min  PT Assessment / Plan / Recommendation Comments on Treatment Session       Follow Up Recommendations  Skilled nursing facility;Supervision/Assistance - 24 hour    Barriers to Discharge        Equipment Recommendations  Defer to next venue    Recommendations for Other Services    Frequency Min 5X/week   Plan Discharge plan remains appropriate    Precautions / Restrictions Precautions Precautions: Shoulder Type of Shoulder Precautions: 30,60,90; PROM  Precaution Booklet Issued: Yes (comment) Required Braces or Orthoses: Other Brace/Splint Restrictions Weight Bearing Restrictions: Yes LUE Weight Bearing: Non weight bearing       Mobility  Bed Mobility Bed Mobility: Not assessed Transfers Transfers: Sit to Stand;Stand to Sit Sit to Stand: 3: Mod assist;With upper extremity assist;With armrests;From chair/3-in-1 Stand to Sit: 4: Min assist;With upper extremity assist;To chair/3-in-1;With armrests Details for Transfer Assistance: (A) to achieve standing, anterior translation of trunk over BOS, balance, controlled descent, & safety.   Ambulation/Gait Ambulation/Gait Assistance: 3: Mod assist Ambulation Distance (Feet): 40 Feet Assistive device: Hemi-walker Ambulation/Gait Assistance Details: (A) for balance & stability.  Cues for safe advancement of hemiwalker, upright posture, & sequencing.  Gait Pattern: Step-to pattern;Decreased step length - right;Decreased step length - left;Trunk flexed Gait velocity: slow Stairs: No    Exercises Shoulder Exercises Pendulum Exercise: AAROM;Seated;10 reps;Left;Other (comment) Shoulder Flexion: PROM;Left;10 reps;Seated Shoulder ABduction: PROM;Left;10 reps;Seated Shoulder External Rotation: PROM;10 reps;Seated Elbow Flexion: AROM;10 reps;Seated;Left Elbow  Extension: AROM;Left;10 reps;Seated Neck Flexion: AROM;10 reps;Seated Neck Extension: AROM;10 reps;Seated Neck Lateral Flexion - Right: AROM;10 reps;Seated Neck Lateral Flexion - Left: AROM;10 reps;Seated   PT Diagnosis:    PT Problem List:   PT Treatment Interventions:     PT Goals Acute Rehab PT Goals PT Goal: Sit to Stand - Progress: Not met PT Goal: Ambulate - Progress: Not met  Visit Information  Last PT Received On: 03/17/12 Assistance Needed: +1    Subjective Data      Cognition  Overall Cognitive Status: Appears within functional limits for tasks assessed/performed Arousal/Alertness: Awake/alert Orientation Level: Appears intact for tasks assessed Behavior During Session: [facility] for tasks performed    Balance  Balance Balance Assessed: No  End of Session PT - End of Session Equipment Utilized During Treatment: Gait belt;Other (comment) Activity Tolerance: Patient tolerated treatment well Patient left: in chair;with call bell/phone within reach    Lara Mulch 03/17/2012, 1:20 PM  Verdell Face, PTA [phone] 03/17/2012

## 2012-03-17 NOTE — Progress Notes (Signed)
Clinical Social Work  CSW faxed dc summary to SNF [facility]) who stated they were ready for patient. CSW prepared dc packet and informed patient and RN of dc. Patient stated she would inform family. CSW coordinated transportation via [place]. CSW is signing off.  [place], [place] [phone]

## 2012-07-25 NOTE — Consult Note (Addendum)
Anesthesia chart review: Patient is a 78 year old female scheduled for right total shoulder arthroplasty versus reverse shoulder arthroplasty on 08/14/2012 by Dr. Rennis Chris.  Her PAT is scheduled for 08/07/12.  History includes non-smoker, HTN, DM2, OSA, anemia, CKD, headaches, afib, normal coronaries by cath 09/2005, complete heart block s/p Guidant PPM (generator change 03/13/10), basal cell cancer, OA.  PCP is Dr. Laurann Montana.  Her Cardiologist is Dr. Armanda Magic.  She has been cleared with low CV risk for this procedure.   EKG on 03/11/12 [facility]) showed v-paced rhythm.  She had an abnormal stress test (moderate inferior ischemia with EF 39%) on 10/03/05 that lead to a cardiac cath on 10/18/05 that showed: 1. False- positive Cardiolite.  2. Normal coronaries.  3. Normal left ventriculogram, ejection fraction of 60-65%   CXR on 03/11/12 showed: 1. No acute cardiopulmonary disease.  2. Large hiatal hernia.  3. Bilateral glenohumeral osteoarthritis.   She is for additional pre-operative testing (ie, labs) at her PAT visit.  Shonna Chock, PA-C 07/25/12 1528  Addendum: 08/15/12 1420 Labs noted from 08/14/12.  PPM RX form has been faxed to [facility].  Short Stay nursing staff to follow-up with [facility] next week if not received.  If not significant change in her status then anticipate she can proceed as planned.

## 2012-07-29 ENCOUNTER — Encounter (HOSPITAL_COMMUNITY): Payer: Self-pay | Admitting: Pharmacy Technician

## 2012-08-07 ENCOUNTER — Other Ambulatory Visit (HOSPITAL_COMMUNITY): Payer: Medicare Other

## 2012-08-14 ENCOUNTER — Encounter (HOSPITAL_COMMUNITY)
Admission: RE | Admit: 2012-08-14 | Discharge: 2012-08-14 | Disposition: A | Discharge status: Home / Self Care | Payer: Medicare Other | Source: Ambulatory Visit | Attending: Orthopedic Surgery | Admitting: Orthopedic Surgery

## 2012-08-14 ENCOUNTER — Encounter (HOSPITAL_COMMUNITY): Payer: Self-pay

## 2012-08-14 LAB — CBC
Hemoglobin: 11.6 g/dL — ABNORMAL LOW (ref 12.0–15.0)
MCH: 28.9 pg (ref 26.0–34.0)
MCV: 89.6 fL (ref 78.0–100.0)
RBC: 4.02 MIL/uL (ref 3.87–5.11)

## 2012-08-14 LAB — BASIC METABOLIC PANEL
CO2: 28 mEq/L (ref 19–32)
Glucose, Bld: 124 mg/dL — ABNORMAL HIGH (ref 70–99)
Potassium: 4.1 mEq/L (ref 3.5–5.1)
Sodium: 142 mEq/L (ref 135–145)

## 2012-08-14 MED ORDER — CHLORHEXIDINE GLUCONATE 4 % EX LIQD: 60.0000 mL | Freq: Once | CUTANEOUS | Status: DC

## 2012-08-14 NOTE — Progress Notes (Signed)
Notified Minerva Areola from [place] of Pacemaker orders [phone] also date and time of surgery. Stated Rep will be notified.

## 2012-08-14 NOTE — Pre-Procedure Instructions (Signed)
20 CHANELE DOUGLAS  08/14/2012   Your procedure is scheduled on:  Thursday August 21, 2012 at 0730 AM  Report to [facility] at 0530 AM.  Call this number if you have problems the morning of surgery: [phone]   Remember:   Do not eat food or drink:After Midnight.      Take these medicines the morning of surgery with A SIP OF WATER:  Tramadol, Gabapentin, and  Metoprolol. Coumadin will be stopped on 08/16/2012.   Do not wear jewelry, make-up or nail polish.  Do not wear lotions, powders, or perfumes. You may wear deodorant.  Do not shave 48 hours prior to surgery  Do not bring valuables to the hospital.  Contacts, dentures or bridgework may not be worn into surgery.  Leave suitcase in the car. After surgery it may be brought to your room.  For patients admitted to the hospital, checkout time is 11:00 AM the day of discharge.   Patients discharged the day of surgery will not be allowed to drive home.    Special Instructions: Incentive Spirometry - Practice and bring it with you on the day of surgery. Shower using CHG 2 nights before surgery and the night before surgery.  If you shower the day of surgery use CHG.  Use special wash - you have one bottle of CHG for all showers.  You should use approximately 1/3 of the bottle for each shower.   Please read over the following fact sheets that you were given: Pain Booklet, Coughing and Deep Breathing, Blood Transfusion Information, MRSA Information and Surgical Site Infection Prevention

## 2012-08-19 NOTE — Progress Notes (Signed)
Faxed PPM orders received.  [phone]) notifed of pt's scheduled procedure and orders.  Rep. Truddie Hidden stated she would notifiy area rep.

## 2012-08-21 ENCOUNTER — Inpatient Hospital Stay (HOSPITAL_COMMUNITY)
Admission: RE | Admit: 2012-08-21 | Discharge: 2012-08-25 | DRG: 484 | Disposition: A | Discharge status: Skilled Nursing Facility | Payer: Medicare Other | Source: Ambulatory Visit | Attending: Orthopedic Surgery | Admitting: Orthopedic Surgery

## 2012-08-21 ENCOUNTER — Encounter (HOSPITAL_COMMUNITY)
Admission: RE | Disposition: A | Discharge status: Skilled Nursing Facility | Payer: Self-pay | Source: Ambulatory Visit | Attending: Orthopedic Surgery

## 2012-08-21 ENCOUNTER — Encounter (HOSPITAL_COMMUNITY): Payer: Self-pay | Admitting: General Practice

## 2012-08-21 ENCOUNTER — Inpatient Hospital Stay (HOSPITAL_COMMUNITY): Payer: Medicare Other | Admitting: Vascular Surgery

## 2012-08-21 ENCOUNTER — Encounter (HOSPITAL_COMMUNITY): Payer: Self-pay | Admitting: Vascular Surgery

## 2012-08-21 DIAGNOSIS — I4891 Unspecified atrial fibrillation: Secondary | ICD-10-CM | POA: Diagnosis present

## 2012-08-21 DIAGNOSIS — M109 Gout, unspecified: Secondary | ICD-10-CM | POA: Diagnosis present

## 2012-08-21 DIAGNOSIS — Z96619 Presence of unspecified artificial shoulder joint: Secondary | ICD-10-CM

## 2012-08-21 DIAGNOSIS — E7800 Pure hypercholesterolemia, unspecified: Secondary | ICD-10-CM | POA: Diagnosis present

## 2012-08-21 DIAGNOSIS — Z888 Allergy status to other drugs, medicaments and biological substances status: Secondary | ICD-10-CM

## 2012-08-21 DIAGNOSIS — Z881 Allergy status to other antibiotic agents status: Secondary | ICD-10-CM

## 2012-08-21 DIAGNOSIS — Z7901 Long term (current) use of anticoagulants: Secondary | ICD-10-CM

## 2012-08-21 DIAGNOSIS — Z01812 Encounter for preprocedural laboratory examination: Secondary | ICD-10-CM

## 2012-08-21 DIAGNOSIS — E119 Type 2 diabetes mellitus without complications: Secondary | ICD-10-CM | POA: Diagnosis present

## 2012-08-21 DIAGNOSIS — K219 Gastro-esophageal reflux disease without esophagitis: Secondary | ICD-10-CM | POA: Diagnosis present

## 2012-08-21 DIAGNOSIS — Z950 Presence of cardiac pacemaker: Secondary | ICD-10-CM

## 2012-08-21 DIAGNOSIS — M549 Dorsalgia, unspecified: Secondary | ICD-10-CM | POA: Diagnosis present

## 2012-08-21 DIAGNOSIS — I129 Hypertensive chronic kidney disease with stage 1 through stage 4 chronic kidney disease, or unspecified chronic kidney disease: Secondary | ICD-10-CM | POA: Diagnosis present

## 2012-08-21 DIAGNOSIS — N189 Chronic kidney disease, unspecified: Secondary | ICD-10-CM | POA: Diagnosis present

## 2012-08-21 DIAGNOSIS — M19019 Primary osteoarthritis, unspecified shoulder: Principal | ICD-10-CM | POA: Diagnosis present

## 2012-08-21 DIAGNOSIS — Z79899 Other long term (current) drug therapy: Secondary | ICD-10-CM

## 2012-08-21 DIAGNOSIS — M19011 Primary osteoarthritis, right shoulder: Secondary | ICD-10-CM | POA: Diagnosis present

## 2012-08-21 DIAGNOSIS — G8929 Other chronic pain: Secondary | ICD-10-CM | POA: Diagnosis present

## 2012-08-21 HISTORY — DX: Gout, unspecified: M10.9

## 2012-08-21 HISTORY — DX: Pure hypercholesterolemia, unspecified: E78.00

## 2012-08-21 HISTORY — DX: Unspecified atrial fibrillation: I48.91

## 2012-08-21 HISTORY — DX: Spinal stenosis, lumbar region without neurogenic claudication: M48.061

## 2012-08-21 HISTORY — DX: Gastro-esophageal reflux disease without esophagitis: K21.9

## 2012-08-21 HISTORY — DX: Dorsalgia, unspecified: M54.9

## 2012-08-21 HISTORY — DX: Other chronic pain: G89.29

## 2012-08-21 HISTORY — PX: TOTAL SHOULDER ARTHROPLASTY: SHX126

## 2012-08-21 HISTORY — DX: Iron deficiency anemia, unspecified: D50.9

## 2012-08-21 HISTORY — DX: Unspecified malignant neoplasm of skin of unspecified upper limb, including shoulder: C44.601

## 2012-08-21 LAB — GLUCOSE, CAPILLARY: Glucose-Capillary: 117 mg/dL — ABNORMAL HIGH (ref 70–99)

## 2012-08-21 LAB — PROTIME-INR
INR: 1.17 (ref 0.00–1.49)
Prothrombin Time: 14.7 seconds (ref 11.6–15.2)

## 2012-08-21 SURGERY — ARTHROPLASTY, SHOULDER, TOTAL
Anesthesia: Regional | Site: Shoulder | Laterality: Right | Wound class: Clean

## 2012-08-21 MED ORDER — PHENOL 1.4 % MT LIQD: 1.0000 | OROMUCOSAL | Status: DC | PRN

## 2012-08-21 MED ORDER — WARFARIN - PHYSICIAN DOSING INPATIENT
Freq: Every day | Status: DC
  Administered 2012-08-23 – 2012-08-24 (×2)

## 2012-08-21 MED ORDER — OXYCODONE HCL 5 MG/5ML PO SOLN: 5.0000 mg | Freq: Once | ORAL | Status: DC | PRN

## 2012-08-21 MED ORDER — PHENYLEPHRINE HCL 10 MG/ML IJ SOLN
INTRAMUSCULAR | Status: DC | PRN
  Administered 2012-08-21: 40 ug via INTRAVENOUS
  Administered 2012-08-21 (×3): 80 ug via INTRAVENOUS

## 2012-08-21 MED ORDER — OXYCODONE HCL 5 MG PO TABS: 5.0000 mg | ORAL_TABLET | Freq: Once | ORAL | Status: DC | PRN

## 2012-08-21 MED ORDER — ROCURONIUM BROMIDE 100 MG/10ML IV SOLN
INTRAVENOUS | Status: DC | PRN
  Administered 2012-08-21: 50 mg via INTRAVENOUS

## 2012-08-21 MED ORDER — ONDANSETRON HCL 4 MG/2ML IJ SOLN
4.0000 mg | Freq: Four times a day (QID) | INTRAMUSCULAR | Status: DC | PRN
  Administered 2012-08-21 – 2012-08-22 (×2): 4 mg via INTRAVENOUS
  Filled 2012-08-21 (×2): qty 2

## 2012-08-21 MED ORDER — MIDAZOLAM HCL 5 MG/5ML IJ SOLN
INTRAMUSCULAR | Status: DC | PRN
  Administered 2012-08-21: 2 mg via INTRAVENOUS

## 2012-08-21 MED ORDER — MIDAZOLAM HCL 2 MG/2ML IJ SOLN: 1.0000 mg | INTRAMUSCULAR | Status: DC | PRN

## 2012-08-21 MED ORDER — LIDOCAINE HCL (CARDIAC) 20 MG/ML IV SOLN
INTRAVENOUS | Status: DC | PRN
  Administered 2012-08-21: 80 mg via INTRAVENOUS

## 2012-08-21 MED ORDER — ONDANSETRON HCL 4 MG/2ML IJ SOLN: 4.0000 mg | Freq: Four times a day (QID) | INTRAMUSCULAR | Status: DC | PRN

## 2012-08-21 MED ORDER — SIMVASTATIN 40 MG PO TABS
40.0000 mg | ORAL_TABLET | Freq: Every evening | ORAL | Status: DC
  Administered 2012-08-21 – 2012-08-24 (×4): 40 mg via ORAL
  Filled 2012-08-21 (×5): qty 1

## 2012-08-21 MED ORDER — PHENYLEPHRINE HCL 10 MG/ML IJ SOLN
10.0000 mg | INTRAVENOUS | Status: DC | PRN
  Administered 2012-08-21: 5 ug/min via INTRAVENOUS

## 2012-08-21 MED ORDER — METOCLOPRAMIDE HCL 10 MG PO TABS: 5.0000 mg | ORAL_TABLET | Freq: Three times a day (TID) | ORAL | Status: DC | PRN

## 2012-08-21 MED ORDER — LACTATED RINGERS IV SOLN
INTRAVENOUS | Status: DC
  Administered 2012-08-22: 11:00:00 via INTRAVENOUS

## 2012-08-21 MED ORDER — HYDROMORPHONE HCL PF 1 MG/ML IJ SOLN: 0.5000 mg | INTRAMUSCULAR | Status: DC | PRN

## 2012-08-21 MED ORDER — ACETAMINOPHEN 650 MG RE SUPP: 650.0000 mg | Freq: Four times a day (QID) | RECTAL | Status: DC | PRN

## 2012-08-21 MED ORDER — GABAPENTIN 300 MG PO CAPS
300.0000 mg | ORAL_CAPSULE | Freq: Three times a day (TID) | ORAL | Status: DC
  Administered 2012-08-21 – 2012-08-25 (×11): 300 mg via ORAL
  Filled 2012-08-21 (×14): qty 1

## 2012-08-21 MED ORDER — WARFARIN SODIUM 2.5 MG PO TABS
2.5000 mg | ORAL_TABLET | ORAL | Status: DC
  Administered 2012-08-21 – 2012-08-24 (×3): 2.5 mg via ORAL
  Filled 2012-08-21 (×3): qty 1

## 2012-08-21 MED ORDER — PROPOFOL 10 MG/ML IV BOLUS
INTRAVENOUS | Status: DC | PRN
  Administered 2012-08-21: 200 mg via INTRAVENOUS

## 2012-08-21 MED ORDER — FUROSEMIDE 40 MG PO TABS
40.0000 mg | ORAL_TABLET | Freq: Every day | ORAL | Status: DC
  Administered 2012-08-22 – 2012-08-25 (×4): 40 mg via ORAL
  Filled 2012-08-21 (×5): qty 1

## 2012-08-21 MED ORDER — ALLOPURINOL 300 MG PO TABS
500.0000 mg | ORAL_TABLET | Freq: Every day | ORAL | Status: DC
  Administered 2012-08-21 – 2012-08-25 (×5): 500 mg via ORAL
  Filled 2012-08-21 (×5): qty 2

## 2012-08-21 MED ORDER — OXYCODONE-ACETAMINOPHEN 5-325 MG PO TABS
1.0000 | ORAL_TABLET | ORAL | Status: DC | PRN
  Administered 2012-08-22: 1 via ORAL
  Administered 2012-08-23: 2 via ORAL
  Filled 2012-08-21: qty 1
  Filled 2012-08-21: qty 2

## 2012-08-21 MED ORDER — WARFARIN SODIUM 5 MG PO TABS
5.0000 mg | ORAL_TABLET | ORAL | Status: DC
  Administered 2012-08-22: 5 mg via ORAL
  Filled 2012-08-21 (×2): qty 1

## 2012-08-21 MED ORDER — LACTATED RINGERS IV SOLN
INTRAVENOUS | Status: DC | PRN
  Administered 2012-08-21 (×2): via INTRAVENOUS

## 2012-08-21 MED ORDER — NEOMYCIN-POLYMYXIN-HC 3.5-10000-1 OT SUSP
2.0000 [drp] | Freq: Three times a day (TID) | OTIC | Status: DC
  Administered 2012-08-21: 3 [drp] via OTIC
  Administered 2012-08-22: 4 [drp] via OTIC
  Administered 2012-08-22 – 2012-08-23 (×3): 3 [drp] via OTIC
  Administered 2012-08-23 – 2012-08-24 (×2): 2 [drp] via OTIC
  Administered 2012-08-24 (×2): 3 [drp] via OTIC
  Administered 2012-08-25: 2 [drp] via OTIC
  Filled 2012-08-21: qty 10

## 2012-08-21 MED ORDER — BUPIVACAINE-EPINEPHRINE PF 0.5-1:200000 % IJ SOLN
INTRAMUSCULAR | Status: DC | PRN
  Administered 2012-08-21: 30 mL

## 2012-08-21 MED ORDER — FENTANYL CITRATE 0.05 MG/ML IJ SOLN: 50.0000 ug | INTRAMUSCULAR | Status: DC | PRN

## 2012-08-21 MED ORDER — 0.9 % SODIUM CHLORIDE (POUR BTL) OPTIME
TOPICAL | Status: DC | PRN
  Administered 2012-08-21: 1000 mL

## 2012-08-21 MED ORDER — ACETAMINOPHEN 325 MG PO TABS
650.0000 mg | ORAL_TABLET | Freq: Four times a day (QID) | ORAL | Status: DC | PRN
  Administered 2012-08-22: 650 mg via ORAL
  Filled 2012-08-21: qty 2

## 2012-08-21 MED ORDER — CEFAZOLIN SODIUM 1-5 GM-% IV SOLN
1.0000 g | Freq: Four times a day (QID) | INTRAVENOUS | Status: AC
  Administered 2012-08-21 – 2012-08-22 (×3): 1 g via INTRAVENOUS
  Filled 2012-08-21 (×4): qty 50

## 2012-08-21 MED ORDER — TRAMADOL HCL 50 MG PO TABS
100.0000 mg | ORAL_TABLET | Freq: Three times a day (TID) | ORAL | Status: DC
  Administered 2012-08-22 – 2012-08-23 (×5): 100 mg via ORAL
  Filled 2012-08-21 (×6): qty 2

## 2012-08-21 MED ORDER — TEMAZEPAM 15 MG PO CAPS: 15.0000 mg | ORAL_CAPSULE | Freq: Every evening | ORAL | Status: DC | PRN

## 2012-08-21 MED ORDER — LACTATED RINGERS IV SOLN: INTRAVENOUS | Status: DC

## 2012-08-21 MED ORDER — DOCUSATE SODIUM 100 MG PO CAPS
100.0000 mg | ORAL_CAPSULE | Freq: Two times a day (BID) | ORAL | Status: DC
  Administered 2012-08-21 – 2012-08-25 (×8): 100 mg via ORAL
  Filled 2012-08-21 (×9): qty 1

## 2012-08-21 MED ORDER — ONDANSETRON HCL 4 MG PO TABS
4.0000 mg | ORAL_TABLET | Freq: Four times a day (QID) | ORAL | Status: DC | PRN
  Administered 2012-08-24: 4 mg via ORAL
  Filled 2012-08-21 (×2): qty 1

## 2012-08-21 MED ORDER — MENTHOL 3 MG MT LOZG: 1.0000 | LOZENGE | OROMUCOSAL | Status: DC | PRN

## 2012-08-21 MED ORDER — ZOLPIDEM TARTRATE 5 MG PO TABS: 5.0000 mg | ORAL_TABLET | Freq: Every evening | ORAL | Status: DC | PRN

## 2012-08-21 MED ORDER — DIPHENHYDRAMINE HCL 12.5 MG/5ML PO ELIX: 12.5000 mg | ORAL_SOLUTION | ORAL | Status: DC | PRN

## 2012-08-21 MED ORDER — ALUM & MAG HYDROXIDE-SIMETH 200-200-20 MG/5ML PO SUSP: 30.0000 mL | ORAL | Status: DC | PRN

## 2012-08-21 MED ORDER — DEXTROSE 5 % IV SOLN
3.0000 g | INTRAVENOUS | Status: AC
  Administered 2012-08-21: 3 g via INTRAVENOUS
  Filled 2012-08-21: qty 3000

## 2012-08-21 MED ORDER — METOCLOPRAMIDE HCL 5 MG/ML IJ SOLN
5.0000 mg | Freq: Three times a day (TID) | INTRAMUSCULAR | Status: DC | PRN
  Administered 2012-08-22: 10 mg via INTRAVENOUS
  Filled 2012-08-21: qty 2

## 2012-08-21 MED ORDER — DIAZEPAM 5 MG PO TABS
5.0000 mg | ORAL_TABLET | Freq: Four times a day (QID) | ORAL | Status: DC | PRN
  Administered 2012-08-22: 5 mg via ORAL
  Filled 2012-08-21: qty 1

## 2012-08-21 MED ORDER — METOPROLOL TARTRATE 50 MG PO TABS
50.0000 mg | ORAL_TABLET | Freq: Two times a day (BID) | ORAL | Status: DC
  Administered 2012-08-22 – 2012-08-25 (×8): 50 mg via ORAL
  Filled 2012-08-21 (×9): qty 1

## 2012-08-21 MED ORDER — FENTANYL CITRATE 0.05 MG/ML IJ SOLN
INTRAMUSCULAR | Status: DC | PRN
  Administered 2012-08-21 (×2): 50 ug via INTRAVENOUS

## 2012-08-21 MED ORDER — HYDROMORPHONE HCL PF 1 MG/ML IJ SOLN: 0.2500 mg | INTRAMUSCULAR | Status: DC | PRN

## 2012-08-21 SURGICAL SUPPLY — 72 items
BLADE SAW SGTL 83.5X18.5 (BLADE) ×2 IMPLANT
BRUSH FEMORAL CANAL (MISCELLANEOUS) IMPLANT
CEMENT BONE DEPUY (Cement) ×2 IMPLANT
CLOTH BEACON ORANGE TIMEOUT ST (SAFETY) ×2 IMPLANT
CLSR STERI-STRIP ANTIMIC 1/2X4 (GAUZE/BANDAGES/DRESSINGS) ×2 IMPLANT
COVER SURGICAL LIGHT HANDLE (MISCELLANEOUS) ×2 IMPLANT
DRAPE INCISE IOBAN 66X45 STRL (DRAPES) ×2 IMPLANT
DRAPE SURG 17X11 SM STRL (DRAPES) ×2 IMPLANT
DRAPE SURG 17X23 STRL (DRAPES) ×2 IMPLANT
DRAPE U-SHAPE 47X51 STRL (DRAPES) ×2 IMPLANT
DRILL BIT 7/64X5 (BIT) ×2 IMPLANT
DRSG AQUACEL AG ADV 3.5X10 (GAUZE/BANDAGES/DRESSINGS) ×2 IMPLANT
DRSG MEPILEX BORDER 4X8 (GAUZE/BANDAGES/DRESSINGS) ×2 IMPLANT
DURAPREP 26ML APPLICATOR (WOUND CARE) ×2 IMPLANT
ELECT BLADE 4.0 EZ CLEAN MEGAD (MISCELLANEOUS) ×2
ELECT CAUTERY BLADE 6.4 (BLADE) ×2 IMPLANT
ELECT REM PT RETURN 9FT ADLT (ELECTROSURGICAL) ×2
ELECTRODE BLDE 4.0 EZ CLN MEGD (MISCELLANEOUS) ×1 IMPLANT
ELECTRODE REM PT RTRN 9FT ADLT (ELECTROSURGICAL) ×1 IMPLANT
FACESHIELD LNG OPTICON STERILE (SAFETY) ×4 IMPLANT
GLENOID ANCHOR PEG CROSSLK 48 (Orthopedic Implant) ×2 IMPLANT
GLOVE BIO SURGEON STRL SZ7.5 (GLOVE) ×2 IMPLANT
GLOVE BIO SURGEON STRL SZ8 (GLOVE) ×2 IMPLANT
GLOVE BIO SURGEON STRL SZ8.5 (GLOVE) ×2 IMPLANT
GLOVE EUDERMIC 7 POWDERFREE (GLOVE) ×2 IMPLANT
GLOVE ORTHO TXT STRL SZ7.5 (GLOVE) ×2 IMPLANT
GLOVE SS BIOGEL STRL SZ 7.5 (GLOVE) ×1 IMPLANT
GLOVE SUPERSENSE BIOGEL SZ 7.5 (GLOVE) ×1
GOWN EXTRA PROTECTION XL (GOWNS) ×2 IMPLANT
GOWN PREVENTION PLUS XXLARGE (GOWN DISPOSABLE) ×2 IMPLANT
GOWN STRL NON-REIN LRG LVL3 (GOWN DISPOSABLE) ×2 IMPLANT
GOWN STRL REIN XL XLG (GOWN DISPOSABLE) ×4 IMPLANT
HANDPIECE INTERPULSE COAX TIP (DISPOSABLE)
HEAD HUM ECCENTRIC 48X18 STRL (Trauma) ×2 IMPLANT
KIT BASIN OR (CUSTOM PROCEDURE TRAY) ×2 IMPLANT
KIT ROOM TURNOVER OR (KITS) ×2 IMPLANT
MANIFOLD NEPTUNE II (INSTRUMENTS) ×2 IMPLANT
NDL SUT 6 .5 CRC .975X.05 MAYO (NEEDLE) ×1 IMPLANT
NEEDLE HYPO 25GX1X1/2 BEV (NEEDLE) IMPLANT
NEEDLE MAYO TAPER (NEEDLE) ×1
NS IRRIG 1000ML POUR BTL (IV SOLUTION) ×2 IMPLANT
PACK SHOULDER (CUSTOM PROCEDURE TRAY) ×2 IMPLANT
PAD ARMBOARD 7.5X6 YLW CONV (MISCELLANEOUS) ×4 IMPLANT
PASSER SUT SWANSON 36MM LOOP (INSTRUMENTS) IMPLANT
PRESSURIZER FEMORAL UNIV (MISCELLANEOUS) IMPLANT
SET HNDPC FAN SPRY TIP SCT (DISPOSABLE) IMPLANT
SLING ARM FOAM STRAP LRG (SOFTGOODS) ×2 IMPLANT
SLING ARM IMMOBILIZER LRG (SOFTGOODS) ×2 IMPLANT
SMARTMIX MINI TOWER (MISCELLANEOUS) ×2
SPONGE LAP 18X18 X RAY DECT (DISPOSABLE) ×4 IMPLANT
SPONGE LAP 4X18 X RAY DECT (DISPOSABLE) ×2 IMPLANT
STEM HUMERAL 12MM (Trauma) ×2 IMPLANT
STRIP CLOSURE SKIN 1/2X4 (GAUZE/BANDAGES/DRESSINGS) ×2 IMPLANT
SUCTION FRAZIER TIP 10 FR DISP (SUCTIONS) ×2 IMPLANT
SUT BONE WAX W31G (SUTURE) IMPLANT
SUT FIBERWIRE #2 38 T-5 BLUE (SUTURE) ×6
SUT MNCRL AB 3-0 PS2 18 (SUTURE) ×4 IMPLANT
SUT VIC AB 1 CT1 27 (SUTURE) ×3
SUT VIC AB 1 CT1 27XBRD ANBCTR (SUTURE) ×3 IMPLANT
SUT VIC AB 2-0 CT1 27 (SUTURE) ×2
SUT VIC AB 2-0 CT1 TAPERPNT 27 (SUTURE) ×2 IMPLANT
SUT VIC AB 2-0 SH 27 (SUTURE)
SUT VIC AB 2-0 SH 27X BRD (SUTURE) IMPLANT
SUTURE FIBERWR #2 38 T-5 BLUE (SUTURE) ×3 IMPLANT
SYR 30ML SLIP (SYRINGE) ×2 IMPLANT
SYR CONTROL 10ML LL (SYRINGE) IMPLANT
TOWEL OR 17X24 6PK STRL BLUE (TOWEL DISPOSABLE) ×2 IMPLANT
TOWEL OR 17X26 10 PK STRL BLUE (TOWEL DISPOSABLE) ×2 IMPLANT
TOWER CARTRIDGE SMART MIX (DISPOSABLE) IMPLANT
TOWER SMARTMIX MINI (MISCELLANEOUS) ×1 IMPLANT
TRAY FOLEY CATH 14FR (SET/KITS/TRAYS/PACK) IMPLANT
WATER STERILE IRR 1000ML POUR (IV SOLUTION) ×2 IMPLANT

## 2012-08-21 NOTE — Op Note (Signed)
08/21/2012  9:47 AM  PATIENT:   Tara Harmon  78 y.o. female  PRE-OPERATIVE DIAGNOSIS:  Osteoarthritis right shoulder   POST-OPERATIVE DIAGNOSIS:  same  PROCEDURE:  Right total shoulder arthroplasty #12 stem, 48X18 eccentric head, 48 glenoid  SURGEON:  Alyzabeth Pontillo, Vania Rea M.D.  ASSISTANTS: [place]ord pac   ANESTHESIA:   GET + ISB  EBL: 300  SPECIMEN:  none  Drains: none   PATIENT DISPOSITION:  PACU - hemodynamically stable.    PLAN OF CARE: Admit to inpatient   Dictation# [id number]

## 2012-08-21 NOTE — Progress Notes (Signed)
UR COMPLETED  

## 2012-08-21 NOTE — Preoperative (Signed)
Beta Blockers   Reason not to administer Beta Blockers:B blocker taken 08/21/12 @ 0430

## 2012-08-21 NOTE — Transfer of Care (Signed)
Immediate Anesthesia Transfer of Care Note  Patient: Tara Harmon  Procedure(s) Performed: Procedure(s) (LRB) with comments: TOTAL SHOULDER ARTHROPLASTY (Right)  Patient Location: PACU  Anesthesia Type: General  Level of Consciousness: awake, alert  and oriented  Airway & Oxygen Therapy: Patient Spontanous Breathing and Patient connected to nasal cannula oxygen  Post-op Assessment: Report given to PACU RN, Post -op Vital signs reviewed and stable and Patient moving all extremities  Post vital signs: Reviewed and stable  Complications: No apparent anesthesia complications

## 2012-08-21 NOTE — Anesthesia Preprocedure Evaluation (Signed)
Anesthesia Evaluation  Patient identified by MRN, date of birth, ID band Patient awake    Reviewed: Allergy & Precautions, H&P , NPO status , Patient's Chart, lab work & pertinent test results  Airway Mallampati: II  Neck ROM: full    Dental   Pulmonary sleep apnea ,          Cardiovascular hypertension, + dysrhythmias Atrial Fibrillation + pacemaker     Neuro/Psych  Headaches,    GI/Hepatic   Endo/Other  diabetes, Type 2  Renal/GU Renal InsufficiencyRenal disease     Musculoskeletal  (+) Arthritis -,   Abdominal   Peds  Hematology   Anesthesia Other Findings   Reproductive/Obstetrics                           Anesthesia Physical Anesthesia Plan  ASA: III  Anesthesia Plan: General and Regional   Post-op Pain Management: MAC Combined w/ Regional for Post-op pain   Induction: Intravenous  Airway Management Planned: Oral ETT  Additional Equipment:   Intra-op Plan:   Post-operative Plan: Extubation in OR  Informed Consent: I have reviewed the patients History and Physical, chart, labs and discussed the procedure including the risks, benefits and alternatives for the proposed anesthesia with the patient or authorized representative who has indicated his/her understanding and acceptance.     Plan Discussed with: CRNA and Surgeon  Anesthesia Plan Comments:         Anesthesia Quick Evaluation

## 2012-08-21 NOTE — Anesthesia Postprocedure Evaluation (Signed)
Anesthesia Post Note  Patient: Tara Harmon  Procedure(s) Performed: Procedure(s) (LRB): TOTAL SHOULDER ARTHROPLASTY (Right)  Anesthesia type: General  Patient location: PACU  Post pain: Pain level controlled and Adequate analgesia  Post assessment: Post-op Vital signs reviewed, Patient's Cardiovascular Status Stable, Respiratory Function Stable, Patent Airway and Pain level controlled  Last Vitals:  Filed Vitals:   08/21/12 1100  BP: 127/53  Pulse: 60  Temp:   Resp: 12    Post vital signs: Reviewed and stable  Level of consciousness: awake, alert  and oriented  Complications: No apparent anesthesia complications

## 2012-08-21 NOTE — Anesthesia Procedure Notes (Addendum)
Procedure Name: Intubation Date/Time: 08/21/2012 7:49 AM Performed by: Rossie Muskrat L Pre-anesthesia Checklist: Patient identified, Patient being monitored, Timeout performed, Emergency Drugs available and Suction available Patient Re-evaluated:Patient Re-evaluated prior to inductionOxygen Delivery Method: Circle system utilized Preoxygenation: Pre-oxygenation with 100% oxygen Intubation Type: IV induction Laryngoscope Size: Miller and 2 Grade View: Grade I Tube type: Oral Tube size: 7.5 mm Number of attempts: 1 Airway Equipment and Method: Stylet Placement Confirmation: ETT inserted through vocal cords under direct vision,  breath sounds checked- equal and bilateral and positive ETCO2 Secured at: 21 cm Tube secured with: Tape Dental Injury: Teeth and Oropharynx as per pre-operative assessment    Anesthesia Regional Block:  Interscalene brachial plexus block  Pre-Anesthetic Checklist: ,, timeout performed, Correct Patient, Correct Site, Correct Laterality, Correct Procedure, Correct Position, site marked, Risks and benefits discussed,  Surgical consent,  Pre-op evaluation,  At surgeon's request and post-op pain management  Laterality: Right  Prep: chloraprep       Needles:  Injection technique: Single-shot  Needle Type: Echogenic Stimulator Needle     Needle Length: 5cm 5 cm Needle Gauge: 22 and 22 G    Additional Needles:  Procedures: ultrasound guided and nerve stimulator Interscalene brachial plexus block  Nerve Stimulator or Paresthesia:  Response: biceps flexion, 0.45 mA,   Additional Responses:   Narrative:  Start time: 08/21/2012 7:01 AM End time: 08/21/2012 7:17 AM Injection made incrementally with aspirations every 5 mL.  Performed by: Personally  Anesthesiologist: Dr Chaney Malling  Additional Notes: Functioning IV was confirmed and monitors were applied.  A 50mm 22ga Arrow echogenic stimulator needle was used. Sterile prep and drape,hand hygiene and  sterile gloves were used.  Negative aspiration and negative test dose prior to incremental administration of local anesthetic. The patient tolerated the procedure well.  Ultrasound guidance: relevent anatomy identified, needle position confirmed, local anesthetic spread visualized around nerve(s), vascular puncture avoided.  Image printed for medical record.   Interscalene brachial plexus block

## 2012-08-21 NOTE — H&P (Signed)
Tara Harmon    Chief Complaint: Osteoarthritis right shoulder  HPI: The patient is a 78 y.o. female with end stage right shoulder arthritis and possible cuff tear arthropathy  Past Medical History  Diagnosis Date  . Pacemaker     [phone]  DR EDMUNDS  . Dysrhythmia     AFIB 03/05/12  AT PPM CHECK   . Hypertension   . Sleep apnea     NO PROBLEMS NOW PER PATIENT , DOES NOT HAVE MACHINE  . Diabetes mellitus   . Anemia     IRON DEF  . Headache   . Cancer     HX SKIN CANCER     . Arthritis     OSTEO, DDD, SCOLIOSIS, SPURS LUMBAR   . Chronic kidney disease     FOLLOWED BY DR Tara Harmon(NO HD)    Past Surgical History  Procedure Date  . Insert / replace / remove pacemaker   . Ankle fracture surgery     RIGHT   04/2006  . Joint replacement     2007  LT KNEE   . Back surgery     DR COHEN  3.5 YRS AGO   . Hemorrhoid surgery     YRS AGO   . Cataract extraction w/ intraocular lens  implant, bilateral   . Total shoulder arthroplasty 03/13/2012    Procedure: TOTAL SHOULDER ARTHROPLASTY;  Surgeon: Tara Lange, MD;  Location: [facility] OR;  Service: Orthopedics;  Laterality: Left;    No family history on file.  Social History:  reports that she has never smoked. She has never used smokeless tobacco. She reports that she does not drink alcohol or use illicit drugs.  Allergies:  Allergies  Allergen Reactions  . Actos (Pioglitazone) Other (See Comments)    Constipation  . Erythromycin Nausea And Vomiting  . Lisinopril Other (See Comments)    Hyperkalimia  . Losartan Other (See Comments)    Hyperkalimia    Medications Prior to Admission  Medication Sig Dispense Refill  . acetaminophen (TYLENOL) 325 MG tablet Take 650 mg by mouth 2 (two) times daily.      Marland Kitchen alendronate (FOSAMAX) 70 MG tablet Take 70 mg by mouth every 7 (seven) days. Sundays, with a full glass of water on an empty stomach.      Marland Kitchen allopurinol (ZYLOPRIM) 100 MG tablet Take 500 mg by mouth daily.      .  furosemide (LASIX) 40 MG tablet Take 40 mg by mouth daily.      Marland Kitchen gabapentin (NEURONTIN) 300 MG capsule Take 300 mg by mouth 3 (three) times daily.      . magnesium oxide (MAG-OX) 400 MG tablet Take 400 mg by mouth daily.      . metoprolol (LOPRESSOR) 50 MG tablet Take 50 mg by mouth 2 (two) times daily.       Marland Kitchen neomycin-polymyxin-hydrocortisone (CORTISPORIN) 3.5-10000-1 otic suspension Place 2-5 drops into the left ear 3 (three) times daily.      . simvastatin (ZOCOR) 40 MG tablet Take 40 mg by mouth every evening.      . traMADol (ULTRAM) 50 MG tablet Take 100 mg by mouth 3 (three) times daily.      . Vitamin D, Ergocalciferol, (DRISDOL) 50000 UNITS CAPS Take 50,000 Units by mouth every 7 (seven) days. Sundays      . warfarin (COUMADIN) 2.5 MG tablet Take 2.5 mg by mouth See admin instructions. Tuesdays, Thursdays, Saturdays and Sundays      .  warfarin (COUMADIN) 5 MG tablet Take 5 mg by mouth 3 (three) times a week. Mondays Wednesdays and Fridays         Physical Exam: right shoulder with painful and restricted motion as documented at7/22/13 office visit  Vitals  Temp:  [98.2 F (36.8 C)] 98.2 F (36.8 C) (10/24 0612) Pulse Rate:  [60] 60  (10/24 0612) Resp:  [16] 16  (10/24 0612) BP: (157)/(82) 157/82 mmHg (10/24 0612) SpO2:  [98 %] 98 % (10/24 0612)  Assessment/Plan  Impression: Osteoarthritis right shoulder   Plan of Action: Procedure(s): TOTAL SHOULDER ARTHROPLASTY vs REVERSE SHOULDER ARTHROPLASTY  Tara Harmon M 08/21/2012, 7:33 AM

## 2012-08-22 DIAGNOSIS — M545 Other chronic pain: Secondary | ICD-10-CM | POA: Diagnosis present

## 2012-08-22 DIAGNOSIS — I4891 Unspecified atrial fibrillation: Secondary | ICD-10-CM | POA: Diagnosis present

## 2012-08-22 DIAGNOSIS — G8929 Other chronic pain: Secondary | ICD-10-CM | POA: Diagnosis present

## 2012-08-22 DIAGNOSIS — M19011 Primary osteoarthritis, right shoulder: Secondary | ICD-10-CM | POA: Diagnosis present

## 2012-08-22 DIAGNOSIS — E119 Type 2 diabetes mellitus without complications: Secondary | ICD-10-CM | POA: Diagnosis present

## 2012-08-22 LAB — GLUCOSE, CAPILLARY: Glucose-Capillary: 179 mg/dL — ABNORMAL HIGH (ref 70–99)

## 2012-08-22 MED ORDER — OXYCODONE-ACETAMINOPHEN 5-325 MG PO TABS: 1.0000 | ORAL_TABLET | ORAL | Status: DC | PRN

## 2012-08-22 MED ORDER — DIAZEPAM 5 MG PO TABS: 5.0000 mg | ORAL_TABLET | Freq: Four times a day (QID) | ORAL | Status: DC | PRN

## 2012-08-22 MED ORDER — DSS 100 MG PO CAPS: 100.0000 mg | ORAL_CAPSULE | Freq: Two times a day (BID) | ORAL | Status: DC | PRN

## 2012-08-22 NOTE — Discharge Summary (Signed)
PATIENT ID:      Tara Harmon  MRN:     [id number] DOB/AGE:    [birth year] / 76 y.o.     DISCHARGE SUMMARY  ADMISSION DATE:    08/21/2012 DISCHARGE DATE:  tentative 08/25/2012  ADMISSION DIAGNOSIS: Osteoarthritis right shoulder  Past Medical History  Diagnosis Date  . Pacemaker     [phone]  DR EDMUNDS  . Hypertension   . Diabetes mellitus   . Chronic kidney disease     FOLLOWED BY DR Aram Beecham WHITE(NO HD)  . Spinal stenosis, lumbar   . Skin cancer of arm     bilaterally  . High cholesterol   . Atrial fibrillation 03/05/12    AT PPM CHECK   . Sleep apnea     NO PROBLEMS NOW PER PATIENT , DOES NOT HAVE MACHINE (08/21/2012)  . Iron deficiency anemia   . GERD (gastroesophageal reflux disease)   . Headache     "not too often" (08/21/2012)  . Arthritis     OSTEO, DDD, SCOLIOSIS, SPURS LUMBAR   . Chronic back pain     "from my waist down" (08/21/2012)  . Gout     DISCHARGE DIAGNOSIS:   Principal Problem:  *Arthritis of shoulder region, right, degenerative Active Problems:  Chronic low back pain  Atrial fibrillation  Diabetes mellitus   PROCEDURE: Procedure(s): Right TOTAL SHOULDER ARTHROPLASTY on 08/21/2012  CONSULTS:   none  HISTORY:  See H&P in chart.  HOSPITAL COURSE:  Tara Harmon is a 78 y.o. admitted on 08/21/2012 with a chief complaint of Right shoulder pain, and found to have a diagnosis of Osteoarthritis right shoulder .  They were brought to the operating room on 08/21/2012 and underwent Procedure(s): TOTAL SHOULDER ARTHROPLASTY.    They were given perioperative antibiotics: Anti-infectives     Start     Dose/Rate Route Frequency Ordered Stop   08/21/12 1600   ceFAZolin (ANCEF) IVPB 1 g/50 mL premix        1 g 100 mL/hr over 30 Minutes Intravenous Every 6 hours 08/21/12 1437 08/22/12 0520   08/21/12 0547   ceFAZolin (ANCEF) 3 g in dextrose 5 % 50 mL IVPB        3 g 160 mL/hr over 30 Minutes Intravenous 60 min pre-op 08/21/12 0547 08/21/12 0800          .  Patient underwent the above named procedure and tolerated it well. The following day they were hemodynamically stable and pain was controlled on oral analgesics. They were neurovascularly intact to the operative extremity. OT was ordered and worked with patient per protocol. Patient has chronic spinal stenosis and is a minimal ambulator with a walker and needs assistance as she lives alone. After her Left TSA she went to [facility] for 4 weeks and this plan did well, she would like the same disposition arrangements. They were medically and orthopaedically stable for discharge on Day of discharge .    DIAGNOSTIC STUDIES:  RECENT LABORATORY STUDIES:   RECENT RADIOGRAPHIC STUDIES :  No results found.  RECENT VITAL SIGNS:  Patient Vitals for the past 24 hrs:  BP Temp Pulse Resp SpO2  08/22/12 0600 102/40 mmHg 99.6 F (37.6 C) 69  18  96 %  08/21/12 2200 111/42 mmHg 99.3 F (37.4 C) 93  18  95 %  08/21/12 1430 163/56 mmHg 98.3 F (36.8 C) 59  16  100 %  08/21/12 1400 130/51 mmHg 98.2 F (36.8 C) 59  20  95 %  08/21/12 1345 123/54 mmHg - 60  19  95 %  08/21/12 1315 121/51 mmHg - 60  19  95 %  08/21/12 1245 113/55 mmHg - 60  21  97 %  08/21/12 1230 127/55 mmHg - 59  22  94 %  08/21/12 1200 116/51 mmHg - 60  15  95 %  08/21/12 1145 125/51 mmHg - 59  14  95 %  08/21/12 1130 108/48 mmHg - 60  16  95 %  08/21/12 1115 120/57 mmHg - 59  16  94 %  08/21/12 1100 127/53 mmHg - 60  12  99 %  08/21/12 1045 107/74 mmHg - 61  12  99 %  08/21/12 1030 125/57 mmHg - 60  13  99 %  08/21/12 1015 125/61 mmHg - 57  31  99 %  08/21/12 1000 125/57 mmHg 97.5 F (36.4 C) 60  14  99 %  .  RECENT EKG RESULTS:    Orders placed during the hospital encounter of 03/13/12  . EKG    DISCHARGE INSTRUCTIONS:    DISCHARGE MEDICATIONS:     Medication List     As of 08/22/2012  8:44 AM    TAKE these medications         acetaminophen 325 MG tablet   Commonly known as: TYLENOL   Take 650 mg  by mouth 2 (two) times daily.      alendronate 70 MG tablet   Commonly known as: FOSAMAX   Take 70 mg by mouth every 7 (seven) days. Sundays, with a full glass of water on an empty stomach.      allopurinol 100 MG tablet   Commonly known as: ZYLOPRIM   Take 500 mg by mouth daily.      diazepam 5 MG tablet   Commonly known as: VALIUM   Take 1 tablet (5 mg total) by mouth every 6 (six) hours as needed for sleep (aand spasm).      DSS 100 MG Caps   Take 100 mg by mouth 2 (two) times daily as needed for constipation.      furosemide 40 MG tablet   Commonly known as: LASIX   Take 40 mg by mouth daily.      gabapentin 300 MG capsule   Commonly known as: NEURONTIN   Take 300 mg by mouth 3 (three) times daily.      magnesium oxide 400 MG tablet   Commonly known as: MAG-OX   Take 400 mg by mouth daily.      metoprolol 50 MG tablet   Commonly known as: LOPRESSOR   Take 50 mg by mouth 2 (two) times daily.      neomycin-polymyxin-hydrocortisone 3.5-10000-1 otic suspension   Commonly known as: CORTISPORIN   Place 2-5 drops into the left ear 3 (three) times daily.      oxyCODONE-acetaminophen 5-325 MG per tablet   Commonly known as: PERCOCET/ROXICET   Take 1-2 tablets by mouth every 4 (four) hours as needed.      simvastatin 40 MG tablet   Commonly known as: ZOCOR   Take 40 mg by mouth every evening.      traMADol 50 MG tablet   Commonly known as: ULTRAM   Take 100 mg by mouth 3 (three) times daily.      Vitamin D (Ergocalciferol) 50000 UNITS Caps   Commonly known as: DRISDOL   Take 50,000 Units by mouth every 7 (seven) days. Sundays  warfarin 5 MG tablet   Commonly known as: COUMADIN   Take 5 mg by mouth 3 (three) times a week. Mondays Wednesdays and Fridays      warfarin 2.5 MG tablet   Commonly known as: COUMADIN   Take 2.5 mg by mouth See admin instructions. Tuesdays, Thursdays, Saturdays and Sundays        FOLLOW UP VISIT:       Follow-up Information     Follow up with Senaida Lange, MD. (call to be seen in 2 weeks)    Contact information:   [facility] [address] [place] [address] [phone]          DISCHARGE TO:  DISPOSITION: 03-Skilled Nursing Facility  DISCHARGE CONDITION:  {Good  Mikaili Flippin for Dr. Francena Hanly 08/22/2012, 8:44 AM   Patient kept through weekend in preparation for DC to skilled facility today. She is afebrile, her incision is dry and NVI to RUE. She is medically and orthopedically stable for DC to Skilled facility.

## 2012-08-22 NOTE — Clinical Social Work Psychosocial (Signed)
     Clinical Social Work Department BRIEF PSYCHOSOCIAL ASSESSMENT 08/22/2012  Patient:  Tara Harmon, Tara Harmon     Account Number:  [id number]     Admit date:  08/21/2012  Clinical Social Worker:  Tiburcio Pea  Date/Time:  08/22/2012 05:16 PM  Referred by:  Physician  Date Referred:  08/22/2012 Referred for  SNF Placement   Other Referral:   Interview type:  Patient Other interview type:    PSYCHOSOCIAL DATA Living Status:  ALONE Admitted from facility:   Level of care:   Primary support name:  Georgina Pillion Primary support relationship to patient:  FAMILY Degree of support available:   Granddaughter [name])  Daughter Jhordan Kinter    CURRENT CONCERNS Current Concerns  Post-Acute Placement   Other Concerns:    SOCIAL WORK ASSESSMENT / PLAN Met with patient this afternoon. She is seeking short term SNF for rehab as she lives alone and states taht she has been a resident of [facility] [address] in the past with her other shoulder surgery. She has been in contact with the facility and states that she has pre-signed with them.  Per patient- "My doctor said I could go there on Monday.  Fl2 and referral sent to [facility] [employer].  CSW did not hear back from admissions prior to their leaving for the day but I do not anticipate any issues with her leaving on Monday.  Fl2 placed on chart for MD's signature.   Assessment/plan status:  Psychosocial Support/Ongoing Assessment of Needs Other assessment/ plan:   Information/referral to community resources:   SNF list deferred by pateint as she has prechosen [facility].  Discussed after care needs- HH/DME to be arranged by SNF if indicated.    PATIENTS/FAMILYS RESPONSE TO PLAN OF CARE: Patient is alert, oriented and very pleasant.  She is invovled in her discharge planning and wants to return to [facility]. Patient was appreicative of CSW's assistance.  Will assist Monday with d/c to SNF.

## 2012-08-22 NOTE — Op Note (Signed)
Tara Harmon, Tara Harmon NO.:  [id number]  MEDICAL RECORD NO.:  [id number]  LOCATION:  5N03C                        FACILITY:  [facility]  PHYSICIAN:  Tara Harmon, M.D.  DATE OF BIRTH:  1931-05-18  DATE OF PROCEDURE:  08/21/2012 DATE OF DISCHARGE:                              OPERATIVE REPORT   PREOPERATIVE DIAGNOSIS:  End-stage right shoulder osteoarthritis.  POSTOPERATIVE DIAGNOSIS:  End-stage right shoulder osteoarthritis.  PROCEDURE:  Right total shoulder arthroplasty utilizing a press-fit size 12 [employer] stem with a 48 x 18 eccentric head and a 48 cemented pegged glenoid.  SURGEON:  Tara Harmon, M.D.  Tara Harmon, P.A.-C.  ANESTHESIA:  General endotracheal as well as an interscalene block.  ESTIMATED BLOOD LOSS:  10 mL.  DRAINS:  None.  HISTORY:  [name] is a 78 year old female who has had chronic progressive increasing right shoulder pain with severe restrictions in mobility related to end-stage glenohumeral arthrosis.  She is status post a left total shoulder arthroplasty that I performed last year.  She has done well from a clinical standpoint and is brought back to the operating room at this time for planned right total shoulder arthroplasty.  Preoperatively I counseled [name] regarding treatment options and risks versus benefits thereof.  Possible surgical complications were reviewed including the potential for bleeding, infection, neurovascular injury, persistent pain, loss of motion, anesthetic complications, failure of the implant, possible need for revision surgery.  She understands and accepts and agrees with our planned procedure.  PROCEDURE IN DETAIL:  After undergoing routine preop evaluation, the patient received prophylactic antibiotics and an interscalene block was established in the holding area by the Anesthesia Department.  Placed supine on the operating table, underwent smooth induction of a  general endotracheal anesthesia.  Placed into beach-chair position and appropriately padded and protected.  Right shoulder examination under anesthesia revealed severely restricted motion with 0 degrees of internal and external rotation.  The right shoulder girdle region was then sterilely prepped and draped in standard fashion.  Time-out was called.  An anterior deltopectoral approach, the right shoulder made through a 15 cm incision.  Skin flaps were elevated and electrocautery was used for hemostasis.  The cephalic vein was identified and retracted laterally.  The deltoid interval was developed proximal and distal. Upper cm of the pectoralis major was tenotomized to enhance visualization.  Adhesions divided the subacromial/subdeltoid bursa.  The conjoined tendon was also mobilized, retracted medially.  The joint was essentially ankylosed and we went ahead and divided the subscapularis leaving a distal cuff of tissue approximately 1.5 cm and then divided anteriorly releasing the capsule.  I should mention we also tenotomized the biceps tendon for later tenodesis.  Once this was completed, we were then able to deliver the humeral head through the wound and then release the capsule from the inferior aspect of the humeral head and neck back to the posterior head to the inferior margin of the rotator cuff posteriorly.  The humeral head was noted to be markedly deformed with a very "dished out" appearance with large osteophytes anteriorly and inferiorly.  Using the extramedullary guide, I resected the proximal humerus, which  there was only a small portion left with marked bony collapse, but this was resected with the oscillating saw at approximately 30 degrees retroversion and then bone was harvested from the segment for bone grafting of her humeral stem later in the case. Hand reaming of the humeral canal was performed up to size 12, which showed appropriate fit.  A cookie cutter broach was  then placed and we broached up to size 12 maintaining approximately 30 degrees of retroversion.  Once this was completed, we placed a metal cap over the cut surface of the proximal humerus.  We did remove the very large osteophytes from the inferior and anterior aspect of the humeral head and neck.  We then exposed the glenoid using a combination of Fukuda, pitchfork, and snake tongue retractors and again the glenoid was markedly deformed.  We removed osteophytes from the margins as well as several bony fragments within the capsular tissues.  I did take this opportunity also to tag the free end of the tenotomized subscapularis with a series of grasping #2 fiber wires then the subscapularis was mobilized circumferentially and did show good elasticity.  Once we had the glenoid circumferentially exposed, a central guidepin was placed, the glenoid was reamed, and a 48 reamer was used, and then removed residual bone spurs at the margins.  The central drill hole and peripheral peg holes were then placed.  The trial 48 glenoid showed good fit.  The glenoid was irrigated.  Cement was mixed in the appropriate consistency, the cement was introduced and then the glenoid was then placed in position impacted with excellent fit and fixation.  Cement was allowed to harden.  As mentioned, cement was placed into the peripheral peg holes.  Once this was fast to our satisfaction, we returned our attention to the proximal humerus and the final size 12 stem was impacted with bone grafting over the metaphyseal segment with excellent fixation.  We then performed trial reductions and the 48 x 18 eccentric showed the best soft tissue balance with good coverage and the final 48 x 18 was impacted into place after the [facility] taper was meticulously cleaned and dried.  Final removal of osteophytes was then completed and overall contour was much to our satisfaction.  We then repaired the subscapularis back to the lesser  tuberosity of the soft tissue stump of the tendon and then placed a figure-of-eight suture at the confluence of the supraspinatus and subscapularis, closed the rotator interval with good reapproximation.  At this point, the soft tissue construct and envelope was much to our satisfaction, and did show proximally 50% translation of the humeral head across the glenoid.  We then performed a tenodesis of the biceps tendon at the lower aspect of bicipital groove, and just above the pec major.  The wound was irrigated.  Hemostasis was obtained.  The deltopectoral interval was then reapproximated with 0 Vicryl sutures, 2-0 Vicryl used for the subcu layer, and intracuticular 3-0 Monocryl for the skin followed by Steri-Strips.  Dry dressing was applied to the right shoulder.  Arm was placed in a sling.  The patient was awakened, extubated, and taken to the recovery room in stable condition.     Tara Rea. Hardin Hardenbrook, M.D.     KMS/MEDQ  D:  08/21/2012  T:  08/22/2012  Job:  [id number]

## 2012-08-22 NOTE — Op Note (Signed)
Tara Harmon, Tara Harmon NO.:  [id number]  MEDICAL RECORD NO.:  [id number]  LOCATION:  5N03C                        FACILITY:  [facility]  PHYSICIAN:  Vania Rea. Quinisha Mould, M.D.  DATE OF BIRTH:  [birth year]  DATE OF PROCEDURE: DATE OF DISCHARGE:                              OPERATIVE REPORT   ADDENDUM:  This is an addendum to the operative note that is recently completely [name].  Surgical assistant throughout this case is Kennith Center, her PAC essential for help with positioning of the extremity, retraction of soft tissues, manipulation of the joint assistant with implantation of the prosthesis, wound closure, and intraoperative decision making.     Vania Rea. Kysen Wetherington, M.D.     KMS/MEDQ  D:  08/21/2012  T:  08/22/2012  Job:  [id number]

## 2012-08-22 NOTE — Clinical Social Work Placement (Addendum)
    Clinical Social Work Department CLINICAL SOCIAL WORK PLACEMENT NOTE 08/22/2012  Patient:  Tara Harmon, Tara Harmon  Account Number:  [id number] Admit date:  08/21/2012  Clinical Social Worker:  Tiburcio Pea  Date/time:  08/22/2012 05:58 PM  Clinical Social Work is seeking post-discharge placement for this patient at the following level of care:   SKILLED NURSING   (*CSW will update this form in Epic as items are completed)     Patient/family provided with [facility] Department of Clinical Social Work's list of facilities offering this level of care within the geographic area requested by the patient (or if unable, by the patient's family).    Patient/family informed of their freedom to choose among providers that offer the needed level of care, that participate in Medicare, Medicaid or managed care program needed by the patient, have an available bed and are willing to accept the patient.    Patient/family informed of [facility]' ownership interest in [facility], as well as of the fact that they are under no obligation to receive care at this facility.  PASARR submitted to EDS on  PASARR number received from EDS on   FL2 transmitted to all facilities in geographic area requested by pt/family on  08/22/2012 FL2 transmitted to all facilities within larger geographic area on   Patient informed that his/her managed care company has contracts with or will negotiate with  certain facilities, including the following:   Has [employer] [facility]  Medicare Complete  Has existing PASARR number in place     Patient/family informed of bed offers received: 08/25/2012    Patient chooses bed at [facility] Physician recommends and patient chooses bed at    Patient to be transferred to [facility] on  08/25/2012 Patient to be transferred to facility by Ambulance  The following physician request were entered in Epic:   Additional Comments: Patient is pleased  with d/c plan. Message left for her granddaughterLanora Manis regarding d/c.  Notified SNF and pt's nurse- Adrienne of d/c.  Lorri Frederick. [place]  [phone]

## 2012-08-22 NOTE — Evaluation (Signed)
Occupational Therapy Evaluation Patient Details Name: Tara Harmon MRN: [id number] DOB: 10-Mar-1931 Today's Date: 08/22/2012 Time:  -     OT Assessment / Plan / Recommendation Clinical Impression  Pt. 78 yo female s/p right TSA. Pt with decreased balance and fear of falling, limiting ambulation and completeion of pendulum exercises. Pt would benefit from OT acutely to address independence with shoulder exercises and ADL's.    OT Assessment  Patient needs continued OT Services    Follow Up Recommendations  Skilled nursing facility    Barriers to Discharge      Equipment Recommendations  None recommended by OT    Recommendations for Other Services    Frequency  Min 2X/week    Precautions / Restrictions Restrictions Weight Bearing Restrictions: Yes RUE Weight Bearing: Non weight bearing   Pertinent Vitals/Pain 6/10 pain, states she did want any pain meds.     ADL  Eating/Feeding: Performed;Set up Where Assessed - Eating/Feeding: Bed level Toilet Transfer: Simulated;+2 Total assistance Toilet Transfer: Patient Percentage: 80% Toilet Transfer Method: Stand pivot Toilet Transfer Equipment: Raised toilet seat with arms (or 3-in-1 over toilet) Equipment Used: Gait belt Transfers/Ambulation Related to ADLs: +2 (A) for stand pivot transfer from bed to chair. Pt. states she uses walker to ambulate at home but is unable to use due to NWB precautions. Required cues for upright posture with facilitation of pelvis and chest. Very fearful of falling due to hx of falling after a knee surgery while in the hospital  ADL Comments: Educated pt on exercises while supine in bed, demonstrates poor recall at end of session only able to recall FF. Unable to complete pendulum exercises due to decrease balance and extreme fear of falling. With +2 (A) had pt static stand without sling to dangle arm for 1-2 minutes. Educated on UB dressing and bathing. Pt able to verbalize how to don sling.     OT  Diagnosis: Generalized weakness;Acute pain  OT Problem List: Decreased strength;Decreased activity tolerance;Pain;Decreased knowledge of precautions;Decreased safety awareness OT Treatment Interventions:     OT Goals Acute Rehab OT Goals OT Goal Formulation: With patient Time For Goal Achievement: 09/05/12 Potential to Achieve Goals: Good ADL Goals Pt Will Perform Upper Body Dressing: with supervision;Sitting, bed;Sitting, chair ADL Goal: Upper Body Dressing - Progress: Goal set today Miscellaneous OT Goals Miscellaneous OT Goal #1: Pt. will recall 100% of shoulder exercises to ensure independence prior to d/c OT Goal: Miscellaneous Goal #1 - Progress: Goal set today Miscellaneous OT Goal #2: Pt. will demonstrate independence with shoulder exercises prior to d/c.  OT Goal: Miscellaneous Goal #2 - Progress: Goal set today  Visit Information  Last OT Received On: 08/22/12    Subjective Data  Subjective: I can onnly walk wityh my walker Patient Stated Goal: To get better   Prior Functioning     Home Living Lives With: Other (Comment) (plans to d/c to [facility]) Prior Function Level of Independence: Independent with assistive device(s) Driving: Yes Communication Communication: No difficulties Dominant Hand: Right         Vision/Perception     Cognition  Overall Cognitive Status: Appears within functional limits for tasks assessed/performed Arousal/Alertness: Awake/alert Orientation Level: Appears intact for tasks assessed Behavior During Session: [facility] for tasks performed    Extremity/Trunk Assessment Left Upper Extremity Assessment LUE ROM/Strength/Tone: [facility] for tasks assessed     Mobility Bed Mobility Bed Mobility: Supine to Sit;Sitting - Scoot to Edge of Bed Supine to Sit: 4: Min assist;HOB elevated  Sitting - Scoot to Edge of Bed: 5: Supervision Details for Bed Mobility Assistance: (A) with bringing shoulder OOB  Transfers Transfers: Sit to  Stand;Stand to Sit Sit to Stand: 1: +2 Total assist;From bed;From chair/3-in-1;With upper extremity assist;With armrests Sit to Stand: Patient Percentage: 70% Stand to Sit: 1: +2 Total assist;With upper extremity assist;To chair/3-in-1;With armrests Stand to Sit: Patient Percentage: 80% Details for Transfer Assistance: (A) with facilitating pelvis and chest for upright posture. VC's for sequencing and hand placement.      Shoulder Instructions Donning/doffing shirt without moving shoulder: Patient able to independently direct caregiver;Moderate assistance Method for sponge bathing under operated UE: Patient able to independently direct caregiver Donning/doffing sling/immobilizer: Supervision/safety;Patient able to independently direct caregiver Correct positioning of sling/immobilizer: Independent ROM for elbow, wrist and digits of operated UE: Supervision/safety Sling wearing schedule (on at all times/off for ADL's): Independent Proper positioning of operated UE when showering: Independent Positioning of UE while sleeping: Independent   Exercise Shoulder Exercises Pendulum Exercise:  (Unable to assess due to decrease balance and fear of falling) Shoulder Flexion: PROM;Right;10 reps;Supine Shoulder ABduction: PROM;Right;Supine;10 reps Shoulder External Rotation: PROM;Right;10 reps;Supine Elbow Flexion: AROM;Seated Elbow Extension: AROM;Seated Wrist Flexion: AROM;Seated Wrist Extension: AROM;Seated Digit Composite Flexion: AROM;Seated Composite Extension: AROM;Seated Neck Flexion: AROM;Seated Neck Extension: AROM;Seated Neck Lateral Flexion - Right: AROM;Seated Neck Lateral Flexion - Left: AROM;Seated   Balance Balance Balance Assessed: Yes Static Standing Balance Static Standing - Balance Support: Bilateral upper extremity supported Static Standing - Level of Assistance: 1: +2 Total assist;Patient percentage (comment) (90%)   End of Session OT - End of Session Equipment  Utilized During Treatment: Gait belt Activity Tolerance: Patient tolerated treatment well Patient left: in chair;with call bell/phone within reach;with family/visitor present;with nursing in room Nurse Communication: Mobility status;Weight bearing status  GO     Cleora Fleet 08/22/2012, 10:23 AM

## 2012-08-22 NOTE — Evaluation (Signed)
I agree with the following treatment note after reviewing documentation.   Johnston, Chyrel Taha Brynn   OTR/L Pager: [phone] Office: [phone] .   

## 2012-08-23 LAB — PROTIME-INR
INR: 1.54 — ABNORMAL HIGH (ref 0.00–1.49)
Prothrombin Time: 18 seconds — ABNORMAL HIGH (ref 11.6–15.2)

## 2012-08-23 LAB — GLUCOSE, CAPILLARY: Glucose-Capillary: 132 mg/dL — ABNORMAL HIGH (ref 70–99)

## 2012-08-23 NOTE — Progress Notes (Signed)
Occupational Therapy Treatment Patient Details Name: Tara Harmon MRN: [id number] DOB: 02/26/1931 Today's Date: 08/23/2012 Time: 7829-5621 OT Time Calculation (min): 32 min  OT Assessment / Plan / Recommendation Comments on Treatment Session Pt. doing better this session, able to recall exercises with increased time but requires vc's for proper technique.     Follow Up Recommendations  Skilled nursing facility    Barriers to Discharge       Equipment Recommendations  None recommended by OT    Recommendations for Other Services    Frequency Min 2X/week   Plan Discharge plan remains appropriate    Precautions / Restrictions Precautions Precautions: Fall Restrictions Weight Bearing Restrictions: Yes RUE Weight Bearing: Non weight bearing   Pertinent Vitals/Pain Soreness in RUE, no numeric score given     ADL  Upper Body Dressing: Performed;Supervision/safety Where Assessed - Upper Body Dressing: Supported sitting Toilet Transfer: Simulated;Maximal assistance Toilet Transfer Method: Stand pivot Toilet Transfer Equipment: Raised toilet seat with arms (or 3-in-1 over toilet) Equipment Used: Gait belt Transfers/Ambulation Related to ADLs: Max (A) for stand pivot transfer with cues for hand placement and sequencing ADL Comments: Pt able to recall 3/3 precautions with increased time. Requires min vc's for proper technique for all exercises. Static standing for about 1 minute to allow arm to dangle during transfer. Able to verbalize correct sequence for UB dressing.      OT Diagnosis:    OT Problem List:   OT Treatment Interventions:     OT Goals Acute Rehab OT Goals OT Goal Formulation: With patient Time For Goal Achievement: 09/05/12 Potential to Achieve Goals: Good ADL Goals Pt Will Perform Upper Body Dressing: with supervision;Sitting, bed;Sitting, chair ADL Goal: Upper Body Dressing - Progress: Met Miscellaneous OT Goals Miscellaneous OT Goal #1: Pt. will recall  100% of shoulder exercises to ensure independence prior to d/c OT Goal: Miscellaneous Goal #1 - Progress: Met Miscellaneous OT Goal #2: Pt. will demonstrate independence with shoulder exercises prior to d/c.  OT Goal: Miscellaneous Goal #2 - Progress: Progressing toward goals  Visit Information  Last OT Received On: 08/23/12 Assistance Needed: +2    Subjective Data      Prior Functioning       Cognition  Overall Cognitive Status: Appears within functional limits for tasks assessed/performed Arousal/Alertness: Awake/alert Orientation Level: Appears intact for tasks assessed Behavior During Session: [facility] for tasks performed    Mobility  Shoulder Instructions Bed Mobility Bed Mobility: Supine to Sit Supine to Sit: 4: Min assist;HOB flat Sitting - Scoot to Edge of Bed: 5: Supervision Details for Bed Mobility Assistance: (A) with bringing shoulder OOB  Transfers Transfers: Sit to Stand;Stand to Sit Sit to Stand: 2: Max assist;With upper extremity assist;From bed Stand to Sit: 3: Mod assist;To chair/3-in-1;With upper extremity assist Details for Transfer Assistance: Cues for slow controlled descent for stand <>sit transfer and for upright posture.    Donning/doffing shirt without moving shoulder: Patient able to independently direct caregiver;Moderate assistance Donning/doffing sling/immobilizer: Modified independent Correct positioning of sling/immobilizer: Independent ROM for elbow, wrist and digits of operated UE: Supervision/safety Sling wearing schedule (on at all times/off for ADL's): Independent Proper positioning of operated UE when showering: Independent Positioning of UE while sleeping: Independent   Exercises  Shoulder Exercises Shoulder Flexion: PROM;Right;10 reps;Supine Shoulder ABduction: PROM;Right;Supine;10 reps Shoulder External Rotation: PROM;Right;10 reps;Supine Elbow Flexion: AROM;Seated Elbow Extension: AROM;Seated Wrist Flexion: AROM;Seated Wrist  Extension: AROM;Seated Digit Composite Flexion: AROM;Seated Composite Extension: AROM;Seated Neck Flexion: AROM;Seated Neck Extension: AROM;Seated Neck Lateral  Flexion - Right: AROM;Seated Neck Lateral Flexion - Left: AROM;Seated   Balance     End of Session OT - End of Session Equipment Utilized During Treatment: Gait belt Activity Tolerance: Patient tolerated treatment well Patient left: in chair;with call bell/phone within reach Nurse Communication: Mobility status  GO     Tara Harmon 08/23/2012, 8:34 AM

## 2012-08-23 NOTE — Progress Notes (Signed)
I agree with the following treatment note after reviewing documentation.   Johnston, Danaye Sobh Brynn   OTR/L Pager: [phone] Office: [phone] .   

## 2012-08-23 NOTE — Progress Notes (Signed)
Subjective: 2 Days Post-Op Procedure(s) (LRB): TOTAL SHOULDER ARTHROPLASTY (Right) Patient reports pain as 3 on 0-10 scale.   Awake and very pleasant.Objective: Vital signs in last 24 hours: Temp:  [98.2 F (36.8 C)-101 F (38.3 C)] 98.2 F (36.8 C) (10/26 0600) Pulse Rate:  [62-83] 71  (10/26 0600) Resp:  [16-18] 16  (10/26 0600) BP: (93-122)/(44-58) 118/46 mmHg (10/26 0600) SpO2:  [93 %-97 %] 97 % (10/26 0600)  Intake/Output from previous day: 10/25 0701 - 10/26 0700 In: 480 [P.O.:480] Out: -  Intake/Output this shift:    No results found for this basename: HGB:5 in the last 72 hours No results found for this basename: WBC:2,RBC:2,HCT:2,PLT:2 in the last 72 hours No results found for this basename: NA:2,K:2,CL:2,CO2:2,BUN:2,CREATININE:2,GLUCOSE:2,CALCIUM:2 in the last 72 hours  Basename 08/23/12 0534 08/22/12 0500  LABPT -- --  INR 1.54* 1.32    Incision: no drainage Pulses 4 good grip and finger movement Assessment/Plan: 2 Days Post-Op Procedure(s) (LRB): TOTAL SHOULDER ARTHROPLASTY (Right) Advance diet Continue therapies  SNF Monday Ruba Outen ANDREW 08/23/2012, 10:12 AM

## 2012-08-24 LAB — PROTIME-INR: Prothrombin Time: 15.7 seconds — ABNORMAL HIGH (ref 11.6–15.2)

## 2012-08-24 NOTE — Progress Notes (Signed)
Subjective: Doing very well this morning. Dressing changed and wound looks fine. No problems with her hand function.   Objective: Vital signs in last 24 hours: Temp:  [97.7 F (36.5 C)-99.4 F (37.4 C)] 97.7 F (36.5 C) (10/27 0536) Pulse Rate:  [76-101] 83  (10/27 0536) Resp:  [16-18] 16  (10/27 0536) BP: (108-130)/(47-54) 117/50 mmHg (10/27 0536) SpO2:  [92 %-98 %] 92 % (10/27 0536)  Intake/Output from previous day:   Intake/Output this shift:    No results found for this basename: HGB:5 in the last 72 hours No results found for this basename: WBC:2,RBC:2,HCT:2,PLT:2 in the last 72 hours No results found for this basename: NA:2,K:2,CL:2,CO2:2,BUN:2,CREATININE:2,GLUCOSE:2,CALCIUM:2 in the last 72 hours  Basename 08/24/12 0605 08/23/12 0534  LABPT -- --  INR 1.28 1.54*    Neurologically intact No cellulitis present  Assessment/Plan: DC Plans for SNF in A.M.   Ayn Domangue A 08/24/2012, 9:00 AM

## 2012-08-25 ENCOUNTER — Encounter (HOSPITAL_COMMUNITY): Payer: Self-pay | Admitting: Orthopedic Surgery

## 2012-08-25 MED ORDER — POTASSIUM CHLORIDE CRYS ER 20 MEQ PO TBCR
EXTENDED_RELEASE_TABLET | ORAL | Status: AC
  Filled 2012-08-25: qty 2

## 2012-08-27 NOTE — Progress Notes (Signed)
Utilization review completed. Zerrick Hanssen, RN, BSN. 

## 2013-01-09 ENCOUNTER — Other Ambulatory Visit: Payer: Self-pay | Admitting: Orthopaedic Surgery

## 2013-01-09 DIAGNOSIS — M48061 Spinal stenosis, lumbar region without neurogenic claudication: Secondary | ICD-10-CM

## 2013-01-27 ENCOUNTER — Ambulatory Visit
Admission: RE | Admit: 2013-01-27 | Discharge: 2013-01-27 | Disposition: A | Discharge status: Home / Self Care | Payer: Medicare Other | Source: Ambulatory Visit | Attending: Orthopaedic Surgery | Admitting: Orthopaedic Surgery

## 2013-01-27 VITALS — BP 105/50 | HR 75

## 2013-01-27 DIAGNOSIS — M48061 Spinal stenosis, lumbar region without neurogenic claudication: Secondary | ICD-10-CM

## 2013-01-27 MED ORDER — IOHEXOL 180 MG/ML  SOLN
15.0000 mL | Freq: Once | INTRAMUSCULAR | Status: AC | PRN
  Administered 2013-01-27: 15 mL via INTRATHECAL

## 2013-01-27 MED ORDER — DIAZEPAM 5 MG PO TABS
5.0000 mg | ORAL_TABLET | Freq: Once | ORAL | Status: AC
  Administered 2013-01-27: 5 mg via ORAL

## 2013-01-27 NOTE — Progress Notes (Addendum)
Pt states she has been off tramadol for the past 3 days. Discharge instructions explained.

## 2013-07-30 ENCOUNTER — Ambulatory Visit (INDEPENDENT_AMBULATORY_CARE_PROVIDER_SITE_OTHER): Payer: Medicare Other | Admitting: Pharmacist

## 2013-07-30 ENCOUNTER — Ambulatory Visit (INDEPENDENT_AMBULATORY_CARE_PROVIDER_SITE_OTHER): Payer: Medicare Other | Admitting: Cardiology

## 2013-07-30 ENCOUNTER — Encounter: Payer: Self-pay | Admitting: Cardiology

## 2013-07-30 VITALS — BP 150/58 | HR 61 | Ht 67.0 in | Wt 176.0 lb

## 2013-07-30 DIAGNOSIS — I4891 Unspecified atrial fibrillation: Secondary | ICD-10-CM

## 2013-07-30 DIAGNOSIS — I5189 Other ill-defined heart diseases: Secondary | ICD-10-CM

## 2013-07-30 DIAGNOSIS — I10 Essential (primary) hypertension: Secondary | ICD-10-CM

## 2013-07-30 DIAGNOSIS — I519 Heart disease, unspecified: Secondary | ICD-10-CM

## 2013-07-30 DIAGNOSIS — Z7901 Long term (current) use of anticoagulants: Secondary | ICD-10-CM

## 2013-07-30 NOTE — Progress Notes (Signed)
[address] [place], [place]  [address] Phone: [phone] Fax:  [phone]  Date:  07/30/2013   ID:  Tara, Harmon [birth year], MRN [id number]  PCP:  Tara Bradford, MD  Cardiologist:  Tara Magic, MD    History of Present Illness: Tara Harmon is a 79 y.o. female who presents for followup of her atrial fibrillation/PPM/diastolic dysfunction and HTN.  She denies any chest pain, SOB, DOE, dizziness, palpitations or syncope.  She occasionally has some mild LE edema if she stands all day.   Wt Readings from Last 3 Encounters:  07/30/13 176 lb (79.833 kg)  08/14/12 175 lb 14.8 oz (79.8 kg)  03/15/12 172 lb (78.019 kg)     Past Medical History  Diagnosis Date  . Pacemaker     [id number]  DR EDMUNDS  . Hypertension   . Diabetes mellitus   . Chronic kidney disease     FOLLOWED BY DR Tara Harmon(NO HD)  . Spinal stenosis, lumbar   . Skin cancer of arm     bilaterally  . High cholesterol   . Sleep apnea     NO PROBLEMS NOW PER PATIENT , DOES NOT HAVE MACHINE (08/21/2012)  . Iron deficiency anemia   . GERD (gastroesophageal reflux disease)   . Headache(784.0)     "not too often" (08/21/2012)  . Arthritis     OSTEO, DDD, SCOLIOSIS, SPURS LUMBAR   . Chronic back pain     "from my waist down" (08/21/2012)  . Gout   . Atrial fibrillation 03/05/12    AT PPM CHECK   . Complete heart block     s/p PPM  . Diastolic dysfunction   . Renal insufficiency     Current Outpatient Prescriptions  Medication Sig Dispense Refill  . acetaminophen (TYLENOL) 325 MG tablet Take 650 mg by mouth 2 (two) times daily.      Marland Kitchen alendronate (FOSAMAX) 70 MG tablet Take 70 mg by mouth every 7 (seven) days. Sundays, with a full glass of water on an empty stomach.      Marland Kitchen allopurinol (ZYLOPRIM) 100 MG tablet Take 500 mg by mouth daily.      Marland Kitchen docusate sodium 100 MG CAPS Take 100 mg by mouth 2 (two) times daily as needed for constipation.  20 capsule  1  . furosemide (LASIX) 40 MG  tablet Take 40 mg by mouth daily.      Marland Kitchen gabapentin (NEURONTIN) 300 MG capsule Take 300 mg by mouth 3 (three) times daily.      . magnesium oxide (MAG-OX) 400 MG tablet Take 400 mg by mouth daily.      . metoprolol (LOPRESSOR) 50 MG tablet Take 50 mg by mouth 2 (two) times daily.       . simvastatin (ZOCOR) 40 MG tablet Take 40 mg by mouth every evening.      . traMADol (ULTRAM) 50 MG tablet Take 100 mg by mouth 3 (three) times daily.      . Vitamin D, Ergocalciferol, (DRISDOL) 50000 UNITS CAPS Take 50,000 Units by mouth every 7 (seven) days. Sundays      . warfarin (COUMADIN) 2 MG tablet Take 2 mg by mouth daily. 2 tablets daily       No current facility-administered medications for this visit.    Allergies:    Allergies  Allergen Reactions  . Actos [Pioglitazone] Other (See Comments)    Constipation  . Lisinopril Other (See Comments)    Hyperkalemia  .  Losartan Other (See Comments)    Hyperkalemia  . Erythromycin Nausea And Vomiting    Social History:  The patient  reports that she has never smoked. She has never used smokeless tobacco. She reports that she does not drink alcohol or use illicit drugs.   Family History:  The patient's family history includes Diabetes in her mother; Heart attack in her mother; Lung cancer in her father.   ROS:  Please see the history of present illness.   Chronic back pain   All other systems reviewed and negative.   PHYSICAL EXAM: VS:  BP 150/58  Pulse 61  Ht 5\' 7"  (1.702 m)  Wt 176 lb (79.833 kg)  BMI 27.56 kg/m2 Well nourished, well developed, in no acute distress HEENT: normal Neck: no JVD Cardiac:  normal S1, S2; RRR; no murmur Lungs:  clear to auscultation bilaterally, no wheezing, rhonchi or rales Abd: soft, nontender, no hepatomegaly Ext: no edema Skin: warm and dry Neuro:  CNs 2-12 intact, no focal abnormalities noted       ASSESSMENT AND PLAN:  1. Paroxysmal atrial fibrillation rate controlled.  - continue metoprolol and  Warfarin 2. Complete Heart Block S/P PPM 3. HTN controlled  -continue Metoprolol 4. Diasotlic dysfunction  - Continue Lasix 5.  Systemic anticoagulation followed in our coumadin clinic  Followup in 6 months   Signed, Tara Magic, MD  07/30/2013 10:58 AM

## 2013-07-30 NOTE — Patient Instructions (Addendum)
Your physician recommends that you schedule a follow-up appointment in: 6 MONTHS with Dr. Mayford Knife

## 2013-08-04 ENCOUNTER — Telehealth: Payer: Self-pay | Admitting: Pharmacist

## 2013-08-04 NOTE — Telephone Encounter (Signed)
Agree with plan as outlined by Alfonse Ras PharmD

## 2013-08-04 NOTE — Telephone Encounter (Signed)
Patient with h/o Afib scheduled to have an epidural steroid injection by Dr. Retia Passe again on 08/11/13, and will need to hold warfarin 5 days prior.  Patient has done this multiple times in the past without complication.    Patient notified to hold warfarin 08/06/13 to 08/10/13, then restart warfarin 4 mg daily on 08/11/13 and recheck INR with me 2 weeks later.  Appointment already set up.  To Dr. Mayford Knife.  Let me know if any objections.

## 2013-08-27 ENCOUNTER — Ambulatory Visit (INDEPENDENT_AMBULATORY_CARE_PROVIDER_SITE_OTHER): Payer: Medicare Other | Admitting: Pharmacist

## 2013-08-27 DIAGNOSIS — I4891 Unspecified atrial fibrillation: Secondary | ICD-10-CM

## 2013-09-04 ENCOUNTER — Encounter (HOSPITAL_COMMUNITY): Payer: Self-pay | Admitting: Emergency Medicine

## 2013-09-04 ENCOUNTER — Emergency Department (HOSPITAL_COMMUNITY)
Admission: EM | Admit: 2013-09-04 | Discharge: 2013-09-04 | Disposition: A | Discharge status: Home / Self Care | Payer: Medicare Other | Attending: Emergency Medicine | Admitting: Emergency Medicine

## 2013-09-04 ENCOUNTER — Emergency Department (HOSPITAL_COMMUNITY): Payer: Medicare Other

## 2013-09-04 DIAGNOSIS — M129 Arthropathy, unspecified: Secondary | ICD-10-CM | POA: Insufficient documentation

## 2013-09-04 DIAGNOSIS — Z8669 Personal history of other diseases of the nervous system and sense organs: Secondary | ICD-10-CM | POA: Insufficient documentation

## 2013-09-04 DIAGNOSIS — Z9849 Cataract extraction status, unspecified eye: Secondary | ICD-10-CM | POA: Insufficient documentation

## 2013-09-04 DIAGNOSIS — S0993XA Unspecified injury of face, initial encounter: Secondary | ICD-10-CM | POA: Insufficient documentation

## 2013-09-04 DIAGNOSIS — Y9389 Activity, other specified: Secondary | ICD-10-CM | POA: Insufficient documentation

## 2013-09-04 DIAGNOSIS — Z85828 Personal history of other malignant neoplasm of skin: Secondary | ICD-10-CM | POA: Insufficient documentation

## 2013-09-04 DIAGNOSIS — Z862 Personal history of diseases of the blood and blood-forming organs and certain disorders involving the immune mechanism: Secondary | ICD-10-CM | POA: Insufficient documentation

## 2013-09-04 DIAGNOSIS — Y9289 Other specified places as the place of occurrence of the external cause: Secondary | ICD-10-CM | POA: Insufficient documentation

## 2013-09-04 DIAGNOSIS — X500XXA Overexertion from strenuous movement or load, initial encounter: Secondary | ICD-10-CM | POA: Insufficient documentation

## 2013-09-04 DIAGNOSIS — S0083XA Contusion of other part of head, initial encounter: Secondary | ICD-10-CM

## 2013-09-04 DIAGNOSIS — Z96659 Presence of unspecified artificial knee joint: Secondary | ICD-10-CM | POA: Insufficient documentation

## 2013-09-04 DIAGNOSIS — S93401A Sprain of unspecified ligament of right ankle, initial encounter: Secondary | ICD-10-CM

## 2013-09-04 DIAGNOSIS — I4891 Unspecified atrial fibrillation: Secondary | ICD-10-CM | POA: Insufficient documentation

## 2013-09-04 DIAGNOSIS — W19XXXA Unspecified fall, initial encounter: Secondary | ICD-10-CM

## 2013-09-04 DIAGNOSIS — K219 Gastro-esophageal reflux disease without esophagitis: Secondary | ICD-10-CM | POA: Insufficient documentation

## 2013-09-04 DIAGNOSIS — M109 Gout, unspecified: Secondary | ICD-10-CM | POA: Insufficient documentation

## 2013-09-04 DIAGNOSIS — Z7901 Long term (current) use of anticoagulants: Secondary | ICD-10-CM | POA: Insufficient documentation

## 2013-09-04 DIAGNOSIS — I129 Hypertensive chronic kidney disease with stage 1 through stage 4 chronic kidney disease, or unspecified chronic kidney disease: Secondary | ICD-10-CM | POA: Insufficient documentation

## 2013-09-04 DIAGNOSIS — E7800 Pure hypercholesterolemia, unspecified: Secondary | ICD-10-CM | POA: Insufficient documentation

## 2013-09-04 DIAGNOSIS — M549 Dorsalgia, unspecified: Secondary | ICD-10-CM | POA: Insufficient documentation

## 2013-09-04 DIAGNOSIS — Z9861 Coronary angioplasty status: Secondary | ICD-10-CM | POA: Insufficient documentation

## 2013-09-04 DIAGNOSIS — Z7983 Long term (current) use of bisphosphonates: Secondary | ICD-10-CM | POA: Insufficient documentation

## 2013-09-04 DIAGNOSIS — G8929 Other chronic pain: Secondary | ICD-10-CM | POA: Insufficient documentation

## 2013-09-04 DIAGNOSIS — Z79899 Other long term (current) drug therapy: Secondary | ICD-10-CM | POA: Insufficient documentation

## 2013-09-04 DIAGNOSIS — Z950 Presence of cardiac pacemaker: Secondary | ICD-10-CM | POA: Insufficient documentation

## 2013-09-04 DIAGNOSIS — E119 Type 2 diabetes mellitus without complications: Secondary | ICD-10-CM | POA: Insufficient documentation

## 2013-09-04 DIAGNOSIS — N189 Chronic kidney disease, unspecified: Secondary | ICD-10-CM | POA: Insufficient documentation

## 2013-09-04 DIAGNOSIS — W1809XA Striking against other object with subsequent fall, initial encounter: Secondary | ICD-10-CM | POA: Insufficient documentation

## 2013-09-04 DIAGNOSIS — S93409A Sprain of unspecified ligament of unspecified ankle, initial encounter: Secondary | ICD-10-CM | POA: Insufficient documentation

## 2013-09-04 DIAGNOSIS — S0003XA Contusion of scalp, initial encounter: Secondary | ICD-10-CM | POA: Insufficient documentation

## 2013-09-04 NOTE — ED Notes (Signed)
Patient fell unwitnessed at 0400 this AM, patient denies loc, alert and oriented, denies cp sob, dizziness.  Went to get up this morning and foot gave out, patient has contusion under right eye and on forehead.  Patient takes coumadin

## 2013-09-04 NOTE — ED Provider Notes (Signed)
CSN: [id number]     Arrival date & time 09/04/13  4098 History   First MD Initiated Contact with Patient 09/04/13 0732     Chief Complaint  Patient presents with  . Fall   (Consider location/radiation/quality/duration/timing/severity/associated sxs/prior Treatment) HPI Comments: Patient is an 79 year old female with an extensive past medical history including atrial fibrillation on coumadin, renal insufficiency, diabetes and hypertension among multiple other medical problems who presents to the emergency department from home after falling around 4:00 this morning. Patient states one week ago she fell and twisted her ankle and has been having pain, this morning when she got out of bed to go to the bathroom, her ankle gave out from underneath her causing her to fall. She hit the right side of her face on the leg of her walker, no loss of consciousness. Prior to the fall denies lightheadedness, dizziness or weakness. Currently she is complaining of generalized neck "soreness" and "soreness" to the right side of her face. Denies confusion, dizziness, lightheadedness, weakness, fatigue or vision changes. Per granddaughter and sister, patient is acting completely normal. Ankle has been swelling over the past week, however patient has been walking and driving on it, pain with walking.  Patient is a 79 y.o. female presenting with fall. The history is provided by the patient and a relative.  Fall Associated symptoms include neck pain.    Past Medical History  Diagnosis Date  . Pacemaker     [phone]  DR EDMUNDS  . Hypertension   . Diabetes mellitus   . Chronic kidney disease     FOLLOWED BY DR Aram Beecham WHITE(NO HD)  . Spinal stenosis, lumbar   . Skin cancer of arm     bilaterally  . High cholesterol   . Sleep apnea     NO PROBLEMS NOW PER PATIENT , DOES NOT HAVE MACHINE (08/21/2012)  . Iron deficiency anemia   . GERD (gastroesophageal reflux disease)   . Headache(784.0)     "not too often"  (08/21/2012)  . Arthritis     OSTEO, DDD, SCOLIOSIS, SPURS LUMBAR   . Chronic back pain     "from my waist down" (08/21/2012)  . Gout   . Atrial fibrillation 03/05/12    AT PPM CHECK   . Complete heart block     s/p PPM  . Diastolic dysfunction   . Renal insufficiency    Past Surgical History  Procedure Laterality Date  . Joint replacement      2007  LT KNEE   . Insert / replace / remove pacemaker  2004; 2011  . Cardiac catheterization    . Excisional hemorrhoidectomy  1980's  . Cataract extraction w/ intraocular lens  implant, bilateral  1990's  . Total knee arthroplasty  04/2006    left  . Ankle fracture surgery  04/2006    left  . Back surgery      DR Noel Gerold; "put spacers in my lower back; ~ 2009" (08/21/2012)  . Skin cancer excision      bilateral arms (08/21/2012)  . Total shoulder arthroplasty  08/21/2012    right w/RCR  . Total shoulder arthroplasty  08/21/2012    Procedure: TOTAL SHOULDER ARTHROPLASTY;  Surgeon: Senaida Lange, MD;  Location: [facility] OR;  Service: Orthopedics;  Laterality: Right;   Family History  Problem Relation Age of Onset  . Diabetes Mother   . Heart attack Mother   . Lung cancer Father    History  Substance Use Topics  . Smoking  status: Never Smoker   . Smokeless tobacco: Never Used  . Alcohol Use: No   OB History   Grav Para Term Preterm Abortions TAB SAB Ect Mult Living                 Review of Systems  HENT:       Positive for R sided facial pain w bruising.  Musculoskeletal: Positive for neck pain.       Positive for R ankle pain/swelling.  All other systems reviewed and are negative.    Allergies  Actos; Lisinopril; Losartan; and Erythromycin  Home Medications   Current Outpatient Rx  Name  Route  Sig  Dispense  Refill  . acetaminophen (TYLENOL) 325 MG tablet   Oral   Take 650 mg by mouth 2 (two) times daily.         Marland Kitchen alendronate (FOSAMAX) 70 MG tablet   Oral   Take 70 mg by mouth every 7 (seven) days. Sundays,  with a full glass of water on an empty stomach.         Marland Kitchen allopurinol (ZYLOPRIM) 100 MG tablet   Oral   Take 500 mg by mouth daily.         Marland Kitchen docusate sodium 100 MG CAPS   Oral   Take 100 mg by mouth 2 (two) times daily as needed for constipation.   20 capsule   1   . furosemide (LASIX) 40 MG tablet   Oral   Take 40 mg by mouth daily.         Marland Kitchen gabapentin (NEURONTIN) 300 MG capsule   Oral   Take 300 mg by mouth 3 (three) times daily.         . magnesium oxide (MAG-OX) 400 MG tablet   Oral   Take 400 mg by mouth daily.         . metoprolol (LOPRESSOR) 50 MG tablet   Oral   Take 50 mg by mouth 2 (two) times daily.          . simvastatin (ZOCOR) 40 MG tablet   Oral   Take 40 mg by mouth every evening.         . traMADol (ULTRAM) 50 MG tablet   Oral   Take 100 mg by mouth 3 (three) times daily.         . Vitamin D, Ergocalciferol, (DRISDOL) 50000 UNITS CAPS   Oral   Take 50,000 Units by mouth every 7 (seven) days. Sundays         . warfarin (COUMADIN) 2 MG tablet   Oral   Take 2 mg by mouth daily. 2 tablets daily          BP 144/80  Pulse 66  Resp 18  Ht 5\' 8"  (1.727 m)  Wt 170 lb (77.111 kg)  BMI 25.85 kg/m2  SpO2 99% Physical Exam  Nursing note and vitals reviewed. Constitutional: She is oriented to person, place, and time. She appears well-developed and well-nourished. No distress.  HENT:  Head: Normocephalic.  Mouth/Throat: Oropharynx is clear and moist.  Small contusion under right eye, tender, no deformity, step off.  Eyes: Conjunctivae and EOM are normal.  Pupils unequal but reactive. No pain with EOM.  Neck: Normal range of motion. Neck supple.  Mild tenderness to bilateral paracervical muscles. No spinous process tenderness.  Cardiovascular: Normal rate, regular rhythm, normal heart sounds and intact distal pulses.   PT/DP pulses +2 on right.  Pulmonary/Chest: Effort  normal and breath sounds normal.  Abdominal: Soft. Bowel  sounds are normal. There is no tenderness.  Musculoskeletal: Normal range of motion. She exhibits no edema.  TTP posterior/lateral aspect of R ankle with edema, bruising. Full ROM.  Neurological: She is alert and oriented to person, place, and time.  Skin: Skin is warm and dry. She is not diaphoretic.  Psychiatric: She has a normal mood and affect. Her behavior is normal.    ED Course  Procedures (including critical care time) Labs Review Labs Reviewed - No data to display Imaging Review Dg Ankle Complete Right  09/04/2013   CLINICAL DATA:  Fall. Heel pain.  EXAM: RIGHT ANKLE - COMPLETE 3+ VIEW  COMPARISON:  None.  FINDINGS: No fracture or dislocation. The ankle mortise is normally space and aligned. The bones are diffusely demineralized. There are dorsal plantar calcaneal spurs.  Mild diffuse soft tissue edema is noted.  IMPRESSION: No fracture or dislocation.   Electronically Signed   By: Amie Portland M.D.   On: 09/04/2013 08:14   Ct Head Wo Contrast  09/04/2013   CLINICAL DATA:  79 year old female with recent fall . Bruising over right eye. Patient on Coumadin.  TECHNIQUE: Multidetector CT imaging of the head, cervical spine, and maxillofacial structures were performed using the standard protocol without intravenous contrast. Multiplanar CT image reconstructions of the cervical spine and maxillofacial structures were also generated.  COMPARISON:  None.  FINDINGS: CT HEAD FINDINGS  No intracranial hemorrhage. No parenchymal contusion. No midline shift or mass effect. Basilar cisterns are patent. No skull base fracture. No fluid in the paranasal sinuses or mastoid air cells.  There is mild generalized cortical atrophy. Mild periventricular subcortical white matter hypodensities.  CT MAXILLOFACIAL FINDINGS  The orbital rims are intact. Zygomatic arches are intact. No fracture of the maxillary bone or pterygoid plates. There is no fluid in the paranasal sinuses. Frontal sinuses are clear. The  mandibular condyles are located. No mandibular fracture.  CT CERVICAL SPINE FINDINGS  No prevertebral soft tissue swelling. Normal alignment of cervical vertebral bodies. No loss of vertebral body height. Normal facet articulation. Normal craniocervical junction. There is multiple levels of endplate osteophytosis most severe from C4 through C7. Associated joint space narrowing. Calcification of the transverse ligaments at C2.  No evidence epidural or paraspinal hematoma.  IMPRESSION: 1. No intracranial trauma. 2. No facial bone fracture. 3. No cervical spine fracture. 4. Multilevel disc osteophytic disease most severe from C4 through C7.  EX HIGHLY THAT AM: CT HEAD WITHOUT CONTRAST  CT MAXILLOFACIAL WITHOUT CONTRAST  CT CERVICAL SPINE WITHOUT CONTRAST   Electronically Signed   By: Genevive Bi M.D.   On: 09/04/2013 08:44   Ct Cervical Spine Wo Contrast  09/04/2013   CLINICAL DATA:  79 year old female with recent fall . Bruising over right eye. Patient on Coumadin.  TECHNIQUE: Multidetector CT imaging of the head, cervical spine, and maxillofacial structures were performed using the standard protocol without intravenous contrast. Multiplanar CT image reconstructions of the cervical spine and maxillofacial structures were also generated.  COMPARISON:  None.  FINDINGS: CT HEAD FINDINGS  No intracranial hemorrhage. No parenchymal contusion. No midline shift or mass effect. Basilar cisterns are patent. No skull base fracture. No fluid in the paranasal sinuses or mastoid air cells.  There is mild generalized cortical atrophy. Mild periventricular subcortical white matter hypodensities.  CT MAXILLOFACIAL FINDINGS  The orbital rims are intact. Zygomatic arches are intact. No fracture of the maxillary bone or pterygoid plates. There  is no fluid in the paranasal sinuses. Frontal sinuses are clear. The mandibular condyles are located. No mandibular fracture.  CT CERVICAL SPINE FINDINGS  No prevertebral soft tissue  swelling. Normal alignment of cervical vertebral bodies. No loss of vertebral body height. Normal facet articulation. Normal craniocervical junction. There is multiple levels of endplate osteophytosis most severe from C4 through C7. Associated joint space narrowing. Calcification of the transverse ligaments at C2.  No evidence epidural or paraspinal hematoma.  IMPRESSION: 1. No intracranial trauma. 2. No facial bone fracture. 3. No cervical spine fracture. 4. Multilevel disc osteophytic disease most severe from C4 through C7.  EX HIGHLY THAT AM: CT HEAD WITHOUT CONTRAST  CT MAXILLOFACIAL WITHOUT CONTRAST  CT CERVICAL SPINE WITHOUT CONTRAST   Electronically Signed   By: Genevive Bi M.D.   On: 09/04/2013 08:44   Ct Maxillofacial Wo Cm  09/04/2013   CLINICAL DATA:  79 year old female with recent fall . Bruising over right eye. Patient on Coumadin.  TECHNIQUE: Multidetector CT imaging of the head, cervical spine, and maxillofacial structures were performed using the standard protocol without intravenous contrast. Multiplanar CT image reconstructions of the cervical spine and maxillofacial structures were also generated.  COMPARISON:  None.  FINDINGS: CT HEAD FINDINGS  No intracranial hemorrhage. No parenchymal contusion. No midline shift or mass effect. Basilar cisterns are patent. No skull base fracture. No fluid in the paranasal sinuses or mastoid air cells.  There is mild generalized cortical atrophy. Mild periventricular subcortical white matter hypodensities.  CT MAXILLOFACIAL FINDINGS  The orbital rims are intact. Zygomatic arches are intact. No fracture of the maxillary bone or pterygoid plates. There is no fluid in the paranasal sinuses. Frontal sinuses are clear. The mandibular condyles are located. No mandibular fracture.  CT CERVICAL SPINE FINDINGS  No prevertebral soft tissue swelling. Normal alignment of cervical vertebral bodies. No loss of vertebral body height. Normal facet articulation. Normal  craniocervical junction. There is multiple levels of endplate osteophytosis most severe from C4 through C7. Associated joint space narrowing. Calcification of the transverse ligaments at C2.  No evidence epidural or paraspinal hematoma.  IMPRESSION: 1. No intracranial trauma. 2. No facial bone fracture. 3. No cervical spine fracture. 4. Multilevel disc osteophytic disease most severe from C4 through C7.  EX HIGHLY THAT AM: CT HEAD WITHOUT CONTRAST  CT MAXILLOFACIAL WITHOUT CONTRAST  CT CERVICAL SPINE WITHOUT CONTRAST   Electronically Signed   By: Genevive Bi M.D.   On: 09/04/2013 08:44    EKG Interpretation   None       MDM   1. Fall, initial encounter   2. Ankle sprain, right, initial encounter   3. Facial contusion, initial encounter     Patient presenting after mechanical fall. She is well appearing and in NAD. C-collar placed due to neck soreness. AAOx3. No focal neuro deficits. It is noted her pupils are unequal but reactive, no vision changes, hx of cataract surgery. She is refusing anything for pain at this time. CT head, maxillofacial and c-spine, xray R foot pending. Case discussed with attending Dr. Romeo Apple who agrees with plan of care. 8:59 AM CT/xrays without acute findings. Will give cam-walker for ankle sprain. RICE, NSAIDs. Return precautions given. F/u with PCP. Patient states understanding of treatment care plan and is agreeable.    Trevor Mace, PA-C 09/04/13 0900

## 2013-09-04 NOTE — ED Notes (Signed)
Family states that patient has "been falling a lot recently."  Patient denies dizziness, changes in vision, cp, sob, n/v.  Patient states that at about 0400 today, she got up to use her walker and her right foot or ankle gave away underneath her and she hit her head, denies loc and states she remembers falling.

## 2013-09-04 NOTE — ED Notes (Signed)
Provider in room  

## 2013-09-05 NOTE — ED Provider Notes (Signed)
Medical screening examination/treatment/procedure(s) were conducted as a shared visit with non-physician practitioner(s) and myself.  I personally evaluated the patient during the encounter.  I interviewed and examined the patient. Lungs are CTAB. Cardiac exam wnl. Abdomen soft.  Mild ttp of right ankle but normal rom. Pt appears well. D/c home if imaging non-contrib.    Junius Argyle, MD 09/05/13 [phone]

## 2013-09-22 ENCOUNTER — Ambulatory Visit (INDEPENDENT_AMBULATORY_CARE_PROVIDER_SITE_OTHER): Payer: Medicare Other | Admitting: General Practice

## 2013-09-22 DIAGNOSIS — I4891 Unspecified atrial fibrillation: Secondary | ICD-10-CM

## 2013-10-13 ENCOUNTER — Ambulatory Visit (INDEPENDENT_AMBULATORY_CARE_PROVIDER_SITE_OTHER): Payer: Medicare Other | Admitting: General Practice

## 2013-10-13 DIAGNOSIS — I4891 Unspecified atrial fibrillation: Secondary | ICD-10-CM

## 2013-10-20 ENCOUNTER — Encounter: Payer: Self-pay | Admitting: Pharmacist

## 2013-10-20 ENCOUNTER — Telehealth: Payer: Self-pay | Admitting: *Deleted

## 2013-10-20 NOTE — Telephone Encounter (Signed)
Called pt and instructed that Augmentin will not affect her INR and to continue coumadin as instructed on last visit and to keep scheduled appt on December 30th and pt states understanding.

## 2013-10-27 ENCOUNTER — Ambulatory Visit (INDEPENDENT_AMBULATORY_CARE_PROVIDER_SITE_OTHER): Payer: Medicare Other

## 2013-10-27 DIAGNOSIS — I4891 Unspecified atrial fibrillation: Secondary | ICD-10-CM

## 2013-10-27 LAB — POCT INR: INR: 2.5

## 2013-10-28 IMAGING — RF DG MYELOGRAM LUMBAR
13 of 17 series · 13 of 17 positions shown · IV contrast (omnipaque)
Comparison: 08/07/2011 and earlier studies

Clincal data: Lumbar spinal stenosis, post interspinous fusion.

LUMBAR MYELOGRAM
CT LUMBAR SPINE WITH INTRATHECAL CONTRAST
TECHNIQUE: An appropriate entry site was determined under
fluoroscopy. Operator donned sterile gloves and mask. Skin site was
marked, prepped with Betadine, and draped in usual sterile fashion,
and infiltrated locally with 1% lidocaine. A 22 gauge spinal needle
was  advanced into the thecal sac at   L2 from a left parasagittal
approach. Clear colorless CSF returned. 17 ml Omnipaque 180 were
administered intrathecally for lumbar myelography, followed by
axial CT scanning of the lumbar spine. I personally performed the
lumbar puncture and administered the intrathecal contrast. I also
personally supervised acquisition of the myelogram images. Coronal
and sagittal reconstructions were generated from the axial scan.

[Series 1: (hospital) · 1 of 1 slices shown]
[im 1/1]
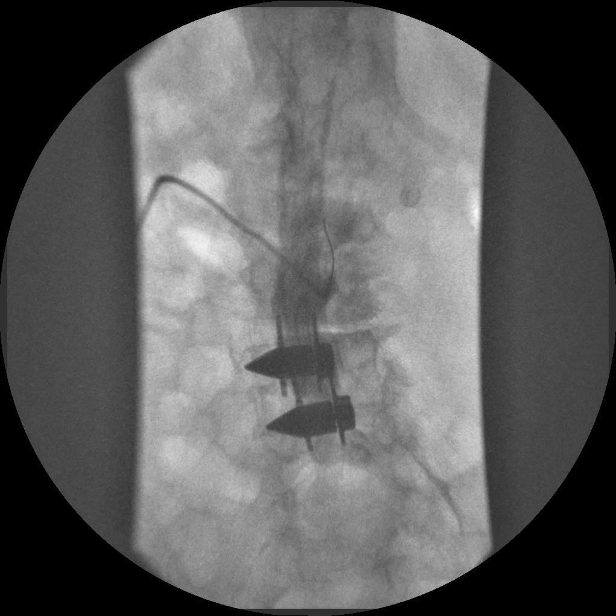

[Series 2: myelogram  white · 1 of 1 slices shown (1 of 9)]
[im 1/1]
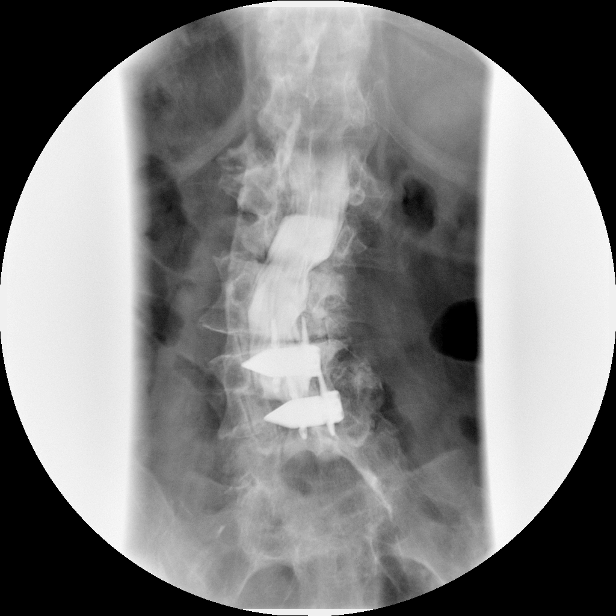

[Series 4: myelogram  white · 1 of 1 slices shown (2 of 9)]
[im 1/1]
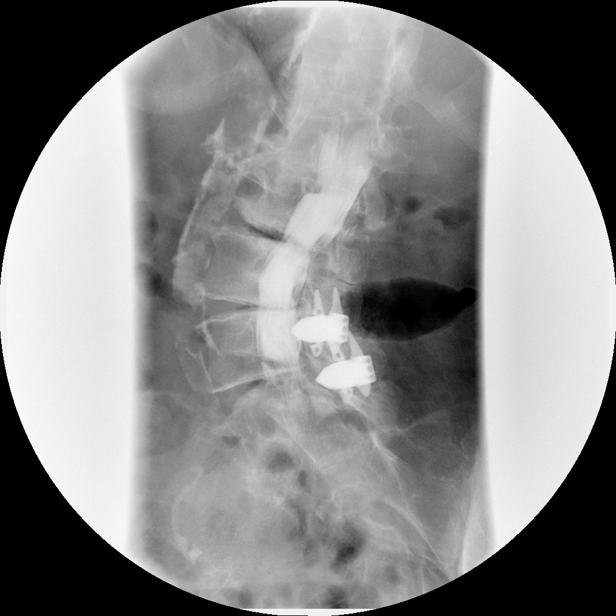

[Series 5: myelogram  white · 1 of 1 slices shown (3 of 9)]
[im 1/1]
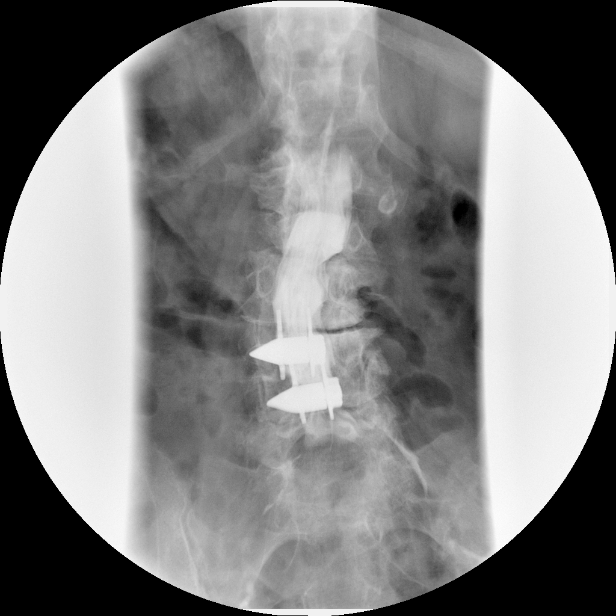

[Series 6: myelogram  white · 1 of 1 slices shown (4 of 9)]
[im 1/1]
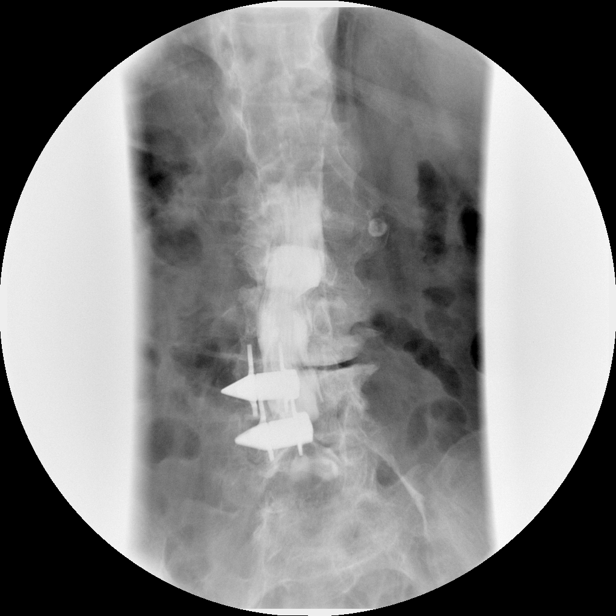

[Series 8: myelogram  white · 1 of 1 slices shown (5 of 9)]
[im 1/1]
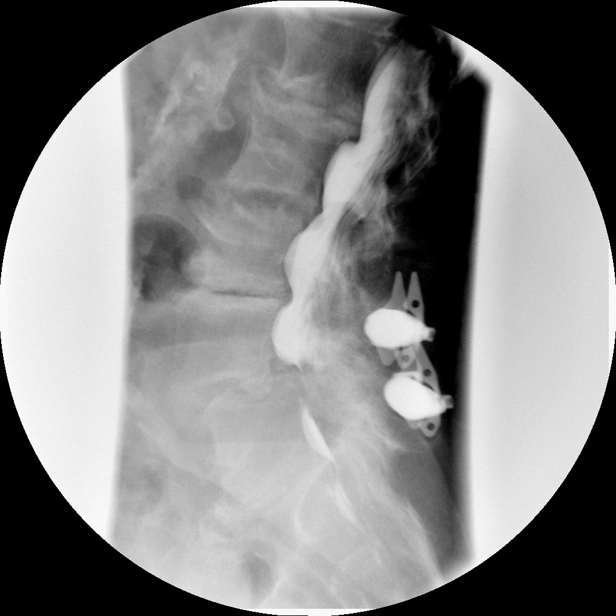

[Series 9: myelogram  white · 1 of 1 slices shown (6 of 9)]
[im 1/1]
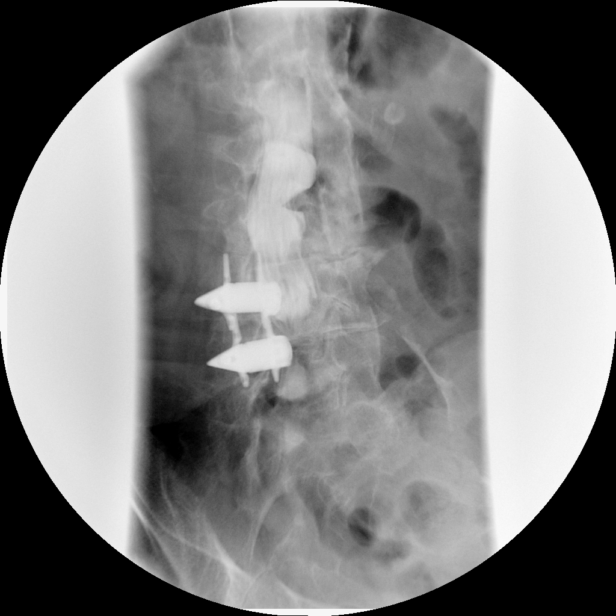

[Series 10: myelogram  white · 1 of 1 slices shown (7 of 9)]
[im 1/1]
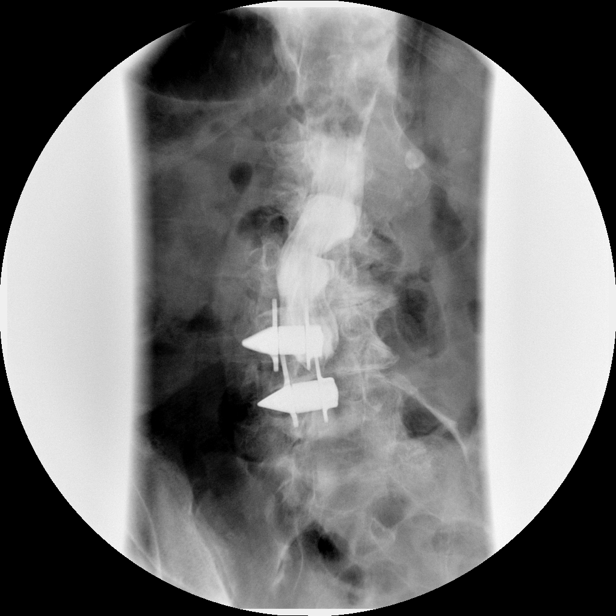

[Series 12: myelogram  white · 1 of 1 slices shown (8 of 9)]
[im 1/1]
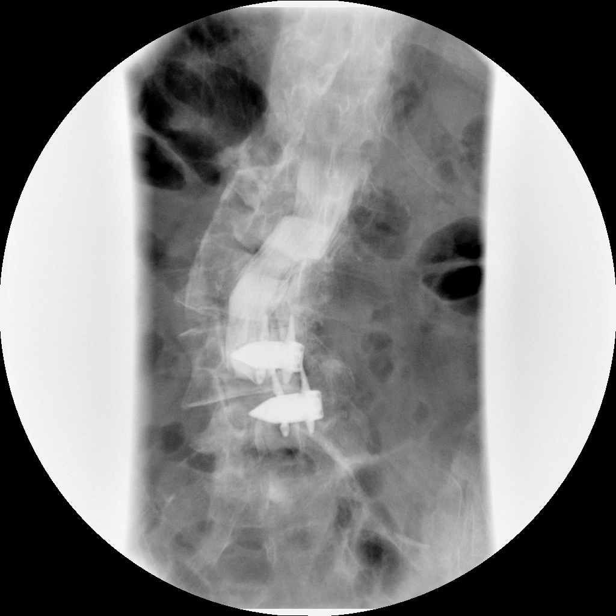

[Series 13: myelogram  white · 1 of 1 slices shown (9 of 9)]
[im 1/1]
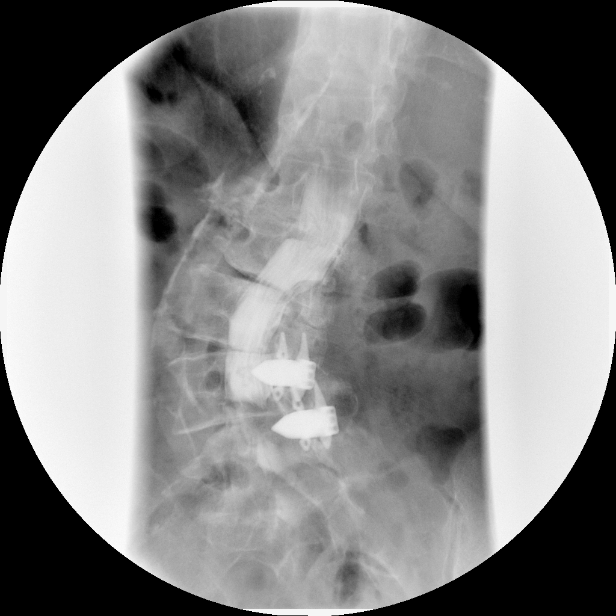

[Series 1001: view not recorded · 0.20mm/px · 1 of 1 slices shown (1 of 3)]
[im 1/1]
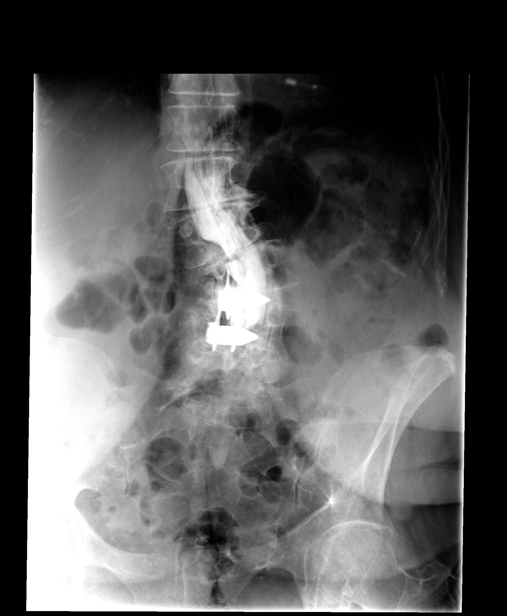

[Series 1003: view not recorded · 0.20mm/px · 1 of 1 slices shown (2 of 3)]
[im 1/1]
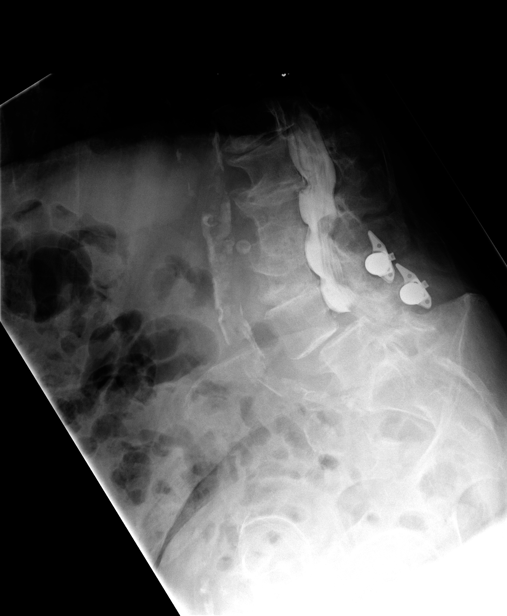

[Series 1004: view not recorded · 0.20mm/px · 1 of 1 slices shown (3 of 3)]
[im 1/1]
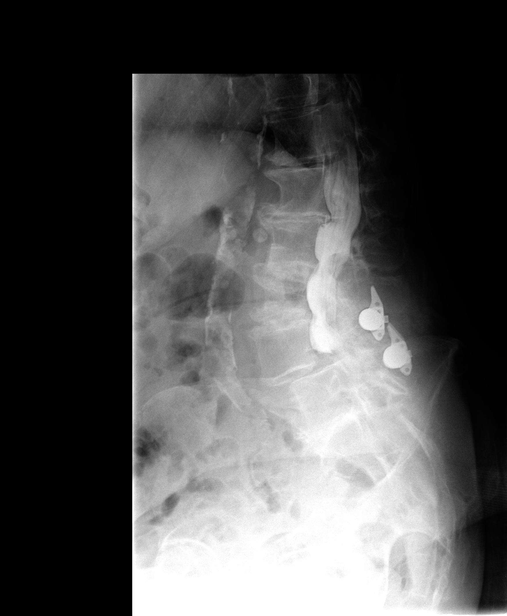

[13 of 17 positions shown; findings below may reference images not displayed]

FINDINGS: Numbering scheme follows that used on prior studies, the
five non-rib bearing segments labeled L1-L5. There is a lumbar
levoscoliosis apex L3-4.

T12-L1:  Asymmetric narrowing of the interspace left greater than
right with vacuum phenomenon. Mild circumferential disc bulge with
a small central protrusion.  No spinal or foraminal stenosis.

L1-2: Moderate narrowing of the interspace, left greater than
right, with exuberant left lateral spurring and vacuum phenomenon.
2 mm retrolisthesis of L1-L2 with a mild pseudobulge.  Endplate
spurring contributes to bilateral foraminal encroachment, left
greater than right.

L2-3: Asymmetric disc narrowing with fairly exuberant vacuum
phenomenon.  Multiple small Schmorl's nodes in the adjacent
endplates.  2-3 mm retrolisthesis of L2-3.  There is a mild
posterior disc bulge with a small right paracentral protrusion.
There is asymmetric facet degenerative hypertrophy contributing to
right foraminal encroachment.

L3-4: Interspinous device without significant distraction.
Asymmetric moderate narrowing of the interspace right greater than
left with vacuum phenomenon.  Mild circumferential disc bulge.
Bilateral facet degenerative hypertrophy and some ligamentum flavum
thickening with mild multifactorial spinal stenosis and bilateral
foraminal encroachment right worse than left.

L4-5: Interspinous device without significant distraction.
Anterolisthesis of L4-L5 measuring 7 mm supine, increasing to 17 mm
with standing, little convincing change on standing lateral flexion
and extension.  There is mild narrowing of the interspace with
vacuum phenomenon.  Exuberant bilateral facet degenerative
hypertrophy and spurring with thickening of the   ligamentum flavum
contributing to moderately severe central canal stenosis as well as
bilateral foraminal stenosis.

L5-S1: Marked narrowing of the interspace with bridging osteophytes
anteriorly. Small calcified left paracentral bulge.  Central canal
and foramina patent.

Heavy aortoiliac arterial calcifications.  Remainder visualized
paraspinal soft tissues unremarkable.
IMPRESSION: 1. Multilevel degenerative changes as enumerated above.  Most
striking is the moderately severe multifactorial spinal stenosis L4-
5, with dynamic instability of   grade I/II anterolisthesis as
detailed above.

## 2013-11-12 ENCOUNTER — Ambulatory Visit (INDEPENDENT_AMBULATORY_CARE_PROVIDER_SITE_OTHER): Payer: Medicare Other | Admitting: Internal Medicine

## 2013-11-12 ENCOUNTER — Encounter: Payer: Self-pay | Admitting: Internal Medicine

## 2013-11-12 ENCOUNTER — Ambulatory Visit (INDEPENDENT_AMBULATORY_CARE_PROVIDER_SITE_OTHER): Payer: Medicare Other

## 2013-11-12 VITALS — BP 135/66 | HR 60 | Ht 67.5 in | Wt 170.0 lb

## 2013-11-12 DIAGNOSIS — I442 Atrioventricular block, complete: Secondary | ICD-10-CM

## 2013-11-12 DIAGNOSIS — Z7901 Long term (current) use of anticoagulants: Secondary | ICD-10-CM

## 2013-11-12 DIAGNOSIS — I4891 Unspecified atrial fibrillation: Secondary | ICD-10-CM

## 2013-11-12 DIAGNOSIS — I10 Essential (primary) hypertension: Secondary | ICD-10-CM

## 2013-11-12 LAB — MDC_IDC_ENUM_SESS_TYPE_INCLINIC
Date Time Interrogation Session: 20150115050000
Lead Channel Impedance Value: 430 Ohm
Lead Channel Impedance Value: 520 Ohm
Lead Channel Pacing Threshold Amplitude: 0.4 V
Lead Channel Pacing Threshold Pulse Width: 0.4 ms
Lead Channel Pacing Threshold Pulse Width: 0.4 ms
Lead Channel Sensing Intrinsic Amplitude: 1.5 mV
Lead Channel Setting Pacing Amplitude: 2.4 V
MDC IDC MSMT LEADCHNL RV PACING THRESHOLD AMPLITUDE: 0.4 V
MDC IDC PG SERIAL: 763350
MDC IDC SET LEADCHNL RA PACING AMPLITUDE: 2 V
MDC IDC SET LEADCHNL RV PACING PULSEWIDTH: 0.4 ms
MDC IDC SET LEADCHNL RV SENSING SENSITIVITY: 2.5 mV
MDC IDC STAT BRADY RA PERCENT PACED: 40 %
MDC IDC STAT BRADY RV PERCENT PACED: 100 %

## 2013-11-12 LAB — POCT INR: INR: 2.5

## 2013-11-12 NOTE — Patient Instructions (Signed)
Your physician wants you to follow-up in: 6 months in device clinic and 12 months with Tara Hutchinson, PA, PA You will receive a reminder letter in the mail two months in advance. If you don't receive a letter, please call our office to schedule the follow-up appointment.

## 2013-11-12 NOTE — Progress Notes (Signed)
Tara Schwalbe, MD: Primary Cardiologist:  Dr Yolanda Bonine Tara Harmon is a 80 y.o. female with a h/o complete heart block sp PPM ([facility]) by Dr Leonia Reeves who presents today to establish care in the Electrophysiology device clinic.  She initially underwent PPM implant in 2004.   The patient reports doing very well since having a pacemaker implanted and remains very active despite her age. She most recently underwent generator change 03/13/2010.  Today, she  denies symptoms of palpitations, chest pain, shortness of breath, orthopnea, PND, lower extremity edema, dizziness, presyncope, syncope, or neurologic sequela.  The patientis tolerating medications without difficulties and is otherwise without complaint today.   Past Medical History  Diagnosis Date  . Pacemaker     [phone]  DR EDMUNDS  . Hypertension   . Spinal stenosis, lumbar   . Skin cancer of arm     bilaterally  . High cholesterol   . Sleep apnea     NO PROBLEMS NOW PER PATIENT , DOES NOT HAVE MACHINE (08/21/2012)  . Iron deficiency anemia   . GERD (gastroesophageal reflux disease)   . Headache(784.0)     "not too often" (08/21/2012)  . Arthritis     OSTEO, DDD, SCOLIOSIS, SPURS LUMBAR   . Chronic back pain     "from my waist down" (08/21/2012)  . Gout   . Atrial fibrillation 03/05/12    AT PPM CHECK   . Complete heart block     s/p PPM  . Diastolic dysfunction   . Type II or unspecified type diabetes mellitus with neurological manifestations, not stated as uncontrolled   . Unspecified vitamin D deficiency   . CKD (chronic kidney disease), stage III     FOLLOWED BY DR [name](NO HD)  . Renal insufficiency   . Polyneuropathy in diabetes   . Osteoarthritis   . Obese     improving  . Bunion   . Eczema   . Colon polyps     History of  . Allergic rhinitis   . Magnesium deficiency     Improved  . Gastritis   . Renal cyst   . Lumbar herniated disc   . Vitiligo    Past Surgical History  Procedure Laterality  Date  . Joint replacement      2007  LT KNEE   . Cardiac catheterization    . Excisional hemorrhoidectomy  1980's  . Cataract extraction w/ intraocular lens  implant, bilateral  1990's  . Total knee arthroplasty  04/2006    left  . Ankle fracture surgery  04/2006    left  . Back surgery      DR Patrice Paradise; "put spacers in my lower back; ~ 2009" (08/21/2012)  . Skin cancer excision      bilateral arms (08/21/2012)  . Total shoulder arthroplasty  08/21/2012    right w/RCR  . Total shoulder arthroplasty  08/21/2012    Procedure: TOTAL SHOULDER ARTHROPLASTY;  Surgeon: Marin Shutter, MD;  Location: [facility];  Service: Orthopedics;  Laterality: Right;  . Pacemaker insertion  11/10/2002    Insertion in 2004  . Pacemaker generator change  03/13/10    [employer] [id number] implanted by Dr Leonia Reeves    History   Social History  . Marital Status: Married    Spouse Name: N/A    Number of Children: N/A  . Years of Education: N/A   Occupational History  . Not on file.   Social History Main Topics  .  Smoking status: Never Smoker   . Smokeless tobacco: Never Used  . Alcohol Use: No  . Drug Use: No  . Sexual Activity: No   Other Topics Concern  . Not on file   Social History Narrative  . No narrative on file    Family History  Problem Relation Age of Onset  . CAD Mother   . Hypertension Mother   . Diabetes Mother   . Lung cancer Father   . Hyperlipidemia Sister   . Diabetes Sister   . Arthritis Brother     rheumatoid arthritis  . Arthritis Paternal Grandmother     rheumatoid arthritis  . Hyperlipidemia Sister   . Diabetes Sister   . Hyperlipidemia Sister   . Diabetes Sister   . Arthritis Daughter     rheumatoid arthritis    Allergies  Allergen Reactions  . Actos [Pioglitazone] Other (See Comments)    Constipation  . Lisinopril Other (See Comments)    Hyperkalemia  . Losartan Other (See Comments)    Hyperkalemia  . Erythromycin Nausea And Vomiting    Current  Outpatient Prescriptions  Medication Sig Dispense Refill  . acetaminophen (TYLENOL) 325 MG tablet Take 650 mg by mouth 2 (two) times daily.      Marland Kitchen alendronate (FOSAMAX) 70 MG tablet Take 70 mg by mouth every Sunday. Sundays, with a full glass of water on an empty stomach.      Marland Kitchen allopurinol (ZYLOPRIM) 100 MG tablet Take 500 mg by mouth daily.      Marland Kitchen docusate sodium 100 MG CAPS Take 100 mg by mouth 2 (two) times daily as needed for constipation.  20 capsule  1  . furosemide (LASIX) 40 MG tablet Take 40 mg by mouth daily.      Marland Kitchen gabapentin (NEURONTIN) 300 MG capsule Take 600 mg by mouth 3 (three) times daily.       . magnesium oxide (MAG-OX) 400 MG tablet Take 400 mg by mouth daily.      . metoprolol (LOPRESSOR) 50 MG tablet Take 50 mg by mouth 2 (two) times daily.       . simvastatin (ZOCOR) 40 MG tablet Take 40 mg by mouth every evening.      . traMADol (ULTRAM) 50 MG tablet Take 100 mg by mouth 3 (three) times daily.      . Vitamin D, Ergocalciferol, (DRISDOL) 50000 UNITS CAPS Take 50,000 Units by mouth every Sunday.       . warfarin (COUMADIN) 2 MG tablet Take 4 mg by mouth daily.        No current facility-administered medications for this visit.    ROS- all systems are reviewed and negative except as per HPI  Physical Exam: Filed Vitals:   11/12/13 1216  BP: 135/66  Pulse: 60  Height: 5' 7.5" (1.715 m)  Weight: 170 lb (77.111 kg)    GEN- The patient is well appearing, alert and oriented x 3 today.   Head- normocephalic, atraumatic Eyes-  Sclera clear, conjunctiva pink Ears- hearing intact Oropharynx- clear Neck- supple, no JVP Lymph- no cervical lymphadenopathy Lungs- Clear to ausculation bilaterally, normal work of breathing Chest- pacemaker pocket is well healed Heart- Regular rate and rhythm, no murmurs, rubs or gallops, PMI not laterally displaced GI- soft, NT, ND, + BS Extremities- no clubbing, cyanosis, or edema Psych- euthymic mood, full affect Neuro- strength and  sensation are intact  Pacemaker interrogation- reviewed in detail today,  See PACEART report ekg today reveals atrial and ventricular  pacing  Assessment and Plan:  1. Complete heart block Normal pacemaker function See Pace Art report No changes today  2. Paroxysmal atrial fibrillation Well controlled chads2vasc score is at least 4.  Continue longterm anticoagualtion  3. HTN Stable No change required today  TTMs Return to the device clinic in 6 months Follow-up with Jerene Pitch in 1 year in the device clinic Dr Radford Pax to see as scheduled

## 2013-11-26 ENCOUNTER — Encounter: Payer: Self-pay | Admitting: Internal Medicine

## 2013-12-02 ENCOUNTER — Other Ambulatory Visit: Payer: Self-pay | Admitting: Gastroenterology

## 2013-12-07 ENCOUNTER — Encounter (HOSPITAL_COMMUNITY): Payer: Self-pay | Admitting: Pharmacy Technician

## 2013-12-10 ENCOUNTER — Ambulatory Visit (INDEPENDENT_AMBULATORY_CARE_PROVIDER_SITE_OTHER): Payer: Medicare Other | Admitting: Pharmacist

## 2013-12-10 DIAGNOSIS — I4891 Unspecified atrial fibrillation: Secondary | ICD-10-CM

## 2013-12-10 LAB — POCT INR: INR: 1.9

## 2013-12-29 ENCOUNTER — Encounter (HOSPITAL_COMMUNITY): Payer: Self-pay

## 2013-12-29 ENCOUNTER — Ambulatory Visit (HOSPITAL_COMMUNITY): Admit: 2013-12-29 | Payer: Medicare Other | Admitting: Gastroenterology

## 2013-12-29 SURGERY — COLONOSCOPY WITH PROPOFOL
Anesthesia: Monitor Anesthesia Care

## 2014-01-07 ENCOUNTER — Ambulatory Visit (INDEPENDENT_AMBULATORY_CARE_PROVIDER_SITE_OTHER): Payer: Medicare Other | Admitting: Pharmacist

## 2014-01-07 DIAGNOSIS — I4891 Unspecified atrial fibrillation: Secondary | ICD-10-CM

## 2014-01-07 LAB — POCT INR: INR: 1.7

## 2014-01-21 ENCOUNTER — Ambulatory Visit (INDEPENDENT_AMBULATORY_CARE_PROVIDER_SITE_OTHER): Payer: Medicare Other | Admitting: Pharmacist Clinician (PhC)/ Clinical Pharmacy Specialist

## 2014-01-21 DIAGNOSIS — I4891 Unspecified atrial fibrillation: Secondary | ICD-10-CM

## 2014-01-21 LAB — POCT INR: INR: 2

## 2014-02-11 ENCOUNTER — Telehealth: Payer: Self-pay | Admitting: Cardiology

## 2014-02-11 NOTE — Telephone Encounter (Signed)
Pt would like  to see if Dr. Radford Pax authorized  for her to stop taking the Warfarin 5 days prior a procedure to cauterized a node in her back. Pt said that she is in a lot of pain and this procedure needs to be  done soon. Pt is scheduled for this procedure on 02/17/14. Please fax clearance to [phone], att Bjorn Loser. Pt is aware that this note to Dr Radford Pax for recommendations.

## 2014-02-11 NOTE — Telephone Encounter (Signed)
Information faxed to Bjorn Loser at [phone], as requested, regarding Warfarin "stop" 5 days prior to surgery, as noted below.   Called patient to inform her of above faxed note to [facility]. She verbalized understanding and appreciation.

## 2014-02-11 NOTE — Telephone Encounter (Signed)
New message     Having a back procedure on 02-17-14.  Need you to fax clearance to stop warfarin 5 days prior to procedure.  Please fax to [facility].

## 2014-02-11 NOTE — Telephone Encounter (Signed)
Ok to stop warfarin 5 days prior to surgery and restart when ok with surgeon

## 2014-02-12 NOTE — Telephone Encounter (Signed)
noted 

## 2014-02-25 ENCOUNTER — Ambulatory Visit (INDEPENDENT_AMBULATORY_CARE_PROVIDER_SITE_OTHER): Payer: Medicare Other

## 2014-02-25 DIAGNOSIS — I4891 Unspecified atrial fibrillation: Secondary | ICD-10-CM

## 2014-02-25 LAB — POCT INR: INR: 2

## 2014-02-26 ENCOUNTER — Other Ambulatory Visit: Payer: Self-pay | Admitting: *Deleted

## 2014-02-26 MED ORDER — WARFARIN SODIUM 2 MG PO TABS: ORAL_TABLET | ORAL | Status: DC

## 2014-03-12 ENCOUNTER — Encounter: Payer: Self-pay | Admitting: Internal Medicine

## 2014-03-12 DIAGNOSIS — I442 Atrioventricular block, complete: Secondary | ICD-10-CM

## 2014-03-30 ENCOUNTER — Ambulatory Visit (INDEPENDENT_AMBULATORY_CARE_PROVIDER_SITE_OTHER): Payer: Medicare Other

## 2014-03-30 DIAGNOSIS — I4891 Unspecified atrial fibrillation: Secondary | ICD-10-CM

## 2014-03-30 LAB — POCT INR: INR: 3.6

## 2014-04-13 ENCOUNTER — Ambulatory Visit (INDEPENDENT_AMBULATORY_CARE_PROVIDER_SITE_OTHER): Payer: Medicare Other | Admitting: Pharmacist Clinician (PhC)/ Clinical Pharmacy Specialist

## 2014-04-13 DIAGNOSIS — I4891 Unspecified atrial fibrillation: Secondary | ICD-10-CM

## 2014-04-13 LAB — POCT INR: INR: 2.8

## 2014-04-16 ENCOUNTER — Encounter (HOSPITAL_BASED_OUTPATIENT_CLINIC_OR_DEPARTMENT_OTHER): Payer: Medicare Other | Attending: General Surgery

## 2014-04-16 DIAGNOSIS — M949 Disorder of cartilage, unspecified: Secondary | ICD-10-CM

## 2014-04-16 DIAGNOSIS — N189 Chronic kidney disease, unspecified: Secondary | ICD-10-CM | POA: Insufficient documentation

## 2014-04-16 DIAGNOSIS — E1149 Type 2 diabetes mellitus with other diabetic neurological complication: Secondary | ICD-10-CM | POA: Insufficient documentation

## 2014-04-16 DIAGNOSIS — S50909A Unspecified superficial injury of unspecified elbow, initial encounter: Secondary | ICD-10-CM | POA: Insufficient documentation

## 2014-04-16 DIAGNOSIS — Z96659 Presence of unspecified artificial knee joint: Secondary | ICD-10-CM | POA: Insufficient documentation

## 2014-04-16 DIAGNOSIS — S70929A Unspecified superficial injury of unspecified thigh, initial encounter: Principal | ICD-10-CM

## 2014-04-16 DIAGNOSIS — Z79899 Other long term (current) drug therapy: Secondary | ICD-10-CM | POA: Insufficient documentation

## 2014-04-16 DIAGNOSIS — G4730 Sleep apnea, unspecified: Secondary | ICD-10-CM | POA: Insufficient documentation

## 2014-04-16 DIAGNOSIS — I129 Hypertensive chronic kidney disease with stage 1 through stage 4 chronic kidney disease, or unspecified chronic kidney disease: Secondary | ICD-10-CM | POA: Insufficient documentation

## 2014-04-16 DIAGNOSIS — S60919A Unspecified superficial injury of unspecified wrist, initial encounter: Secondary | ICD-10-CM

## 2014-04-16 DIAGNOSIS — IMO0002 Reserved for concepts with insufficient information to code with codable children: Secondary | ICD-10-CM | POA: Insufficient documentation

## 2014-04-16 DIAGNOSIS — M109 Gout, unspecified: Secondary | ICD-10-CM | POA: Insufficient documentation

## 2014-04-16 DIAGNOSIS — Z96619 Presence of unspecified artificial shoulder joint: Secondary | ICD-10-CM | POA: Insufficient documentation

## 2014-04-16 DIAGNOSIS — I4891 Unspecified atrial fibrillation: Secondary | ICD-10-CM | POA: Insufficient documentation

## 2014-04-16 DIAGNOSIS — Z950 Presence of cardiac pacemaker: Secondary | ICD-10-CM | POA: Insufficient documentation

## 2014-04-16 DIAGNOSIS — M899 Disorder of bone, unspecified: Secondary | ICD-10-CM | POA: Insufficient documentation

## 2014-04-16 DIAGNOSIS — Z7901 Long term (current) use of anticoagulants: Secondary | ICD-10-CM | POA: Insufficient documentation

## 2014-04-16 DIAGNOSIS — S90919A Unspecified superficial injury of unspecified ankle, initial encounter: Principal | ICD-10-CM

## 2014-04-16 DIAGNOSIS — S50919A Unspecified superficial injury of unspecified forearm, initial encounter: Secondary | ICD-10-CM

## 2014-04-16 DIAGNOSIS — E1142 Type 2 diabetes mellitus with diabetic polyneuropathy: Secondary | ICD-10-CM | POA: Insufficient documentation

## 2014-04-16 DIAGNOSIS — S80929A Unspecified superficial injury of unspecified lower leg, initial encounter: Principal | ICD-10-CM | POA: Insufficient documentation

## 2014-04-16 DIAGNOSIS — S70919A Unspecified superficial injury of unspecified hip, initial encounter: Secondary | ICD-10-CM | POA: Insufficient documentation

## 2014-04-16 NOTE — Progress Notes (Signed)
[facility]  NAME:  Tara Harmon, Tara Harmon                ACCOUNT NO.:  [id number]  MEDICAL RECORD NO.:  [id number]      DATE OF BIRTH:  [birth year]  PHYSICIAN:  Ricard Dillon, M.D. VISIT DATE:  04/16/2014                                  OFFICE VISIT   Ms. Like arrives for consultation for wounds on her right arm and also her left anterior leg.    [name] tells [place] that she has very frail skin secondary to long- standing steroid use for polymyalgia rheumatica among other issues, however, the wounds are largely trauma.  She tells [place] that a week ago, she traumatized her dorsal right arm on the side of her bathroom door and then dropped some frozen meat on her left anterior leg.  Both of these have left her with small wounds.  She also gives [place] a history of multiple falls, although she does not relate any recurrent problems to this.  PAST MEDICAL HISTORY:  Pacemaker, hypertension, spinal stenosis, basal cell CA of the hands, herniated disk, type 2 diabetes, Charcot foot on the left, gout, vitiligo in her arms, atrial fib, chronic kidney disease, sleep apnea, and osteopenia.  PAST SURGICAL HISTORY:  Knee replacement, ankle fracture repair, back surgery, cataracts, and bilateral shoulder replacement.  MEDICATIONS: 1. Fosamax 70 weekly. 2. Zyloprim 100 daily. 3. Lasix 40 daily. 4. Neurontin 300 t.i.d. 5. Mag oxide 400 t.i.d. 6. Lopressor 50 b.i.d. 7. Zocor 40 daily. 8. Coumadin 2 mg daily.  PHYSICAL EXAMINATION:  GENERAL:  Somewhat frail, but generally very pleasant orientated woman. VITAL SIGNS:  Temperature is 97.7, pulse 61, respirations 16, blood pressure 122/70. RESPIRATORY:  Clear. CARDIAC:  There is a pacemaker present.  No murmurs.  No signs of heart failure. VASCULAR:  Peripheral pulses are reduced.  Her ABI on the left leg is 1.07 as calculated in this clinic.  She has Charcot deformities in her left foot, but not her right.  She tells me she  has had nerve damage from ankle surgery in the past.  WOUND EXAM:  She has 2 small areas, one on the left anterior leg, one on the right arm.  The area in the right arm had some denuded surface epithelium that we removed with pickups and scissors.  I did not debride the lower extremity wound.  IMPRESSIONS:  Traumatic wounds in the setting of diabetic neuropathy and chronic steroid use according to the patient, although not see this on her medication list generated in this clinic.  To the right arm, we used a Vaseline gauze and a Kerlix wrap.  To the left leg, collagen, Hydrogel, and a Kerlix Coban wrap.  We will see her again in a week's time.  Then, we may be able to get home health and to help with this.          ______________________________ Ricard Dillon, M.D.     MGR/MEDQ  D:  04/16/2014  T:  04/16/2014  Job:  [id number]

## 2014-04-29 ENCOUNTER — Ambulatory Visit: Payer: Medicare Other | Attending: Family Medicine

## 2014-04-29 ENCOUNTER — Encounter (HOSPITAL_BASED_OUTPATIENT_CLINIC_OR_DEPARTMENT_OTHER): Payer: Medicare Other | Attending: Internal Medicine

## 2014-04-29 DIAGNOSIS — Z950 Presence of cardiac pacemaker: Secondary | ICD-10-CM | POA: Insufficient documentation

## 2014-04-29 DIAGNOSIS — S6990XA Unspecified injury of unspecified wrist, hand and finger(s), initial encounter: Secondary | ICD-10-CM | POA: Diagnosis not present

## 2014-04-29 DIAGNOSIS — I442 Atrioventricular block, complete: Secondary | ICD-10-CM | POA: Diagnosis not present

## 2014-04-29 DIAGNOSIS — M51379 Other intervertebral disc degeneration, lumbosacral region without mention of lumbar back pain or lower extremity pain: Secondary | ICD-10-CM | POA: Insufficient documentation

## 2014-04-29 DIAGNOSIS — S59909A Unspecified injury of unspecified elbow, initial encounter: Secondary | ICD-10-CM | POA: Diagnosis not present

## 2014-04-29 DIAGNOSIS — L97809 Non-pressure chronic ulcer of other part of unspecified lower leg with unspecified severity: Secondary | ICD-10-CM | POA: Diagnosis not present

## 2014-04-29 DIAGNOSIS — I872 Venous insufficiency (chronic) (peripheral): Secondary | ICD-10-CM | POA: Diagnosis present

## 2014-04-29 DIAGNOSIS — R293 Abnormal posture: Secondary | ICD-10-CM | POA: Diagnosis not present

## 2014-04-29 DIAGNOSIS — M545 Low back pain, unspecified: Secondary | ICD-10-CM | POA: Insufficient documentation

## 2014-04-29 DIAGNOSIS — IMO0001 Reserved for inherently not codable concepts without codable children: Secondary | ICD-10-CM | POA: Insufficient documentation

## 2014-04-29 DIAGNOSIS — IMO0002 Reserved for concepts with insufficient information to code with codable children: Secondary | ICD-10-CM | POA: Insufficient documentation

## 2014-04-29 DIAGNOSIS — M5137 Other intervertebral disc degeneration, lumbosacral region: Secondary | ICD-10-CM | POA: Insufficient documentation

## 2014-04-29 DIAGNOSIS — R269 Unspecified abnormalities of gait and mobility: Secondary | ICD-10-CM | POA: Insufficient documentation

## 2014-04-29 DIAGNOSIS — S59919A Unspecified injury of unspecified forearm, initial encounter: Secondary | ICD-10-CM

## 2014-05-05 ENCOUNTER — Ambulatory Visit: Payer: Medicare Other | Admitting: Rehabilitation

## 2014-05-05 DIAGNOSIS — IMO0001 Reserved for inherently not codable concepts without codable children: Secondary | ICD-10-CM | POA: Diagnosis not present

## 2014-05-06 DIAGNOSIS — I872 Venous insufficiency (chronic) (peripheral): Secondary | ICD-10-CM | POA: Diagnosis not present

## 2014-05-06 DIAGNOSIS — IMO0002 Reserved for concepts with insufficient information to code with codable children: Secondary | ICD-10-CM | POA: Diagnosis not present

## 2014-05-06 DIAGNOSIS — S59909A Unspecified injury of unspecified elbow, initial encounter: Secondary | ICD-10-CM | POA: Diagnosis not present

## 2014-05-06 DIAGNOSIS — L97809 Non-pressure chronic ulcer of other part of unspecified lower leg with unspecified severity: Secondary | ICD-10-CM | POA: Diagnosis not present

## 2014-05-11 ENCOUNTER — Ambulatory Visit: Payer: Medicare Other

## 2014-05-11 DIAGNOSIS — IMO0001 Reserved for inherently not codable concepts without codable children: Secondary | ICD-10-CM | POA: Diagnosis not present

## 2014-05-13 ENCOUNTER — Ambulatory Visit (INDEPENDENT_AMBULATORY_CARE_PROVIDER_SITE_OTHER): Payer: Medicare Other | Admitting: *Deleted

## 2014-05-13 ENCOUNTER — Ambulatory Visit: Payer: Medicare Other | Admitting: Physical Therapy

## 2014-05-13 DIAGNOSIS — I442 Atrioventricular block, complete: Secondary | ICD-10-CM

## 2014-05-13 DIAGNOSIS — S59909A Unspecified injury of unspecified elbow, initial encounter: Secondary | ICD-10-CM | POA: Diagnosis not present

## 2014-05-13 DIAGNOSIS — I872 Venous insufficiency (chronic) (peripheral): Secondary | ICD-10-CM | POA: Diagnosis not present

## 2014-05-13 DIAGNOSIS — IMO0002 Reserved for concepts with insufficient information to code with codable children: Secondary | ICD-10-CM | POA: Diagnosis not present

## 2014-05-13 DIAGNOSIS — I4891 Unspecified atrial fibrillation: Secondary | ICD-10-CM

## 2014-05-13 DIAGNOSIS — Z950 Presence of cardiac pacemaker: Secondary | ICD-10-CM

## 2014-05-13 DIAGNOSIS — IMO0001 Reserved for inherently not codable concepts without codable children: Secondary | ICD-10-CM | POA: Diagnosis not present

## 2014-05-13 DIAGNOSIS — L97809 Non-pressure chronic ulcer of other part of unspecified lower leg with unspecified severity: Secondary | ICD-10-CM | POA: Diagnosis not present

## 2014-05-13 LAB — MDC_IDC_ENUM_SESS_TYPE_INCLINIC
Brady Statistic RV Percent Paced: 100 %
Implantable Pulse Generator Serial Number: 763350
Lead Channel Impedance Value: 430 Ohm
Lead Channel Impedance Value: 520 Ohm
Lead Channel Pacing Threshold Amplitude: 0.5 V
Lead Channel Pacing Threshold Pulse Width: 0.4 ms
Lead Channel Pacing Threshold Pulse Width: 0.4 ms
Lead Channel Sensing Intrinsic Amplitude: 1.5 mV
Lead Channel Setting Pacing Amplitude: 2 V
Lead Channel Setting Sensing Sensitivity: 2.5 mV
MDC IDC MSMT LEADCHNL RA PACING THRESHOLD AMPLITUDE: 0.4 V
MDC IDC SESS DTM: 20150716
MDC IDC SET LEADCHNL RV PACING AMPLITUDE: 2.4 V
MDC IDC SET LEADCHNL RV PACING PULSEWIDTH: 0.4 ms
MDC IDC STAT BRADY RA PERCENT PACED: 53 %

## 2014-05-13 LAB — POCT INR: INR: 3.5

## 2014-05-13 NOTE — Progress Notes (Signed)
Pacemaker check in clinic. Normal device function. Thresholds, sensing, impedances consistent with previous measurements. Device programmed to maximize longevity. 1 ATR---6 sec. No high ventricular rates noted. Device programmed at appropriate safety margins. Histogram distribution appropriate for patient activity level. Device programmed to optimize intrinsic conduction. Estimated longevity 5.70yrs. ROV w/ Dr. Rayann Heman Jan/16.

## 2014-05-18 ENCOUNTER — Ambulatory Visit: Payer: Medicare Other | Admitting: Rehabilitation

## 2014-05-18 DIAGNOSIS — IMO0001 Reserved for inherently not codable concepts without codable children: Secondary | ICD-10-CM | POA: Diagnosis not present

## 2014-05-20 ENCOUNTER — Ambulatory Visit: Payer: Medicare Other | Admitting: Rehabilitation

## 2014-05-20 ENCOUNTER — Encounter: Payer: Medicare Other | Admitting: Rehabilitation

## 2014-05-20 DIAGNOSIS — IMO0002 Reserved for concepts with insufficient information to code with codable children: Secondary | ICD-10-CM | POA: Diagnosis not present

## 2014-05-20 DIAGNOSIS — L97809 Non-pressure chronic ulcer of other part of unspecified lower leg with unspecified severity: Secondary | ICD-10-CM | POA: Diagnosis not present

## 2014-05-20 DIAGNOSIS — IMO0001 Reserved for inherently not codable concepts without codable children: Secondary | ICD-10-CM | POA: Diagnosis not present

## 2014-05-20 DIAGNOSIS — S59909A Unspecified injury of unspecified elbow, initial encounter: Secondary | ICD-10-CM | POA: Diagnosis not present

## 2014-05-20 DIAGNOSIS — I872 Venous insufficiency (chronic) (peripheral): Secondary | ICD-10-CM | POA: Diagnosis not present

## 2014-05-26 ENCOUNTER — Ambulatory Visit (INDEPENDENT_AMBULATORY_CARE_PROVIDER_SITE_OTHER): Payer: Medicare Other | Admitting: Pharmacist

## 2014-05-26 ENCOUNTER — Other Ambulatory Visit: Payer: Self-pay | Admitting: Nephrology

## 2014-05-26 DIAGNOSIS — N183 Chronic kidney disease, stage 3 unspecified: Secondary | ICD-10-CM

## 2014-05-26 DIAGNOSIS — I4891 Unspecified atrial fibrillation: Secondary | ICD-10-CM

## 2014-05-26 LAB — POCT INR: INR: 2.2

## 2014-06-01 ENCOUNTER — Ambulatory Visit: Payer: Medicare Other | Admitting: Rehabilitation

## 2014-06-03 ENCOUNTER — Ambulatory Visit: Payer: Medicare Other | Attending: Family Medicine | Admitting: Rehabilitation

## 2014-06-03 DIAGNOSIS — R269 Unspecified abnormalities of gait and mobility: Secondary | ICD-10-CM | POA: Insufficient documentation

## 2014-06-03 DIAGNOSIS — R293 Abnormal posture: Secondary | ICD-10-CM | POA: Insufficient documentation

## 2014-06-03 DIAGNOSIS — I442 Atrioventricular block, complete: Secondary | ICD-10-CM | POA: Diagnosis not present

## 2014-06-03 DIAGNOSIS — M51379 Other intervertebral disc degeneration, lumbosacral region without mention of lumbar back pain or lower extremity pain: Secondary | ICD-10-CM | POA: Insufficient documentation

## 2014-06-03 DIAGNOSIS — M545 Low back pain, unspecified: Secondary | ICD-10-CM | POA: Insufficient documentation

## 2014-06-03 DIAGNOSIS — M5137 Other intervertebral disc degeneration, lumbosacral region: Secondary | ICD-10-CM | POA: Insufficient documentation

## 2014-06-03 DIAGNOSIS — Z950 Presence of cardiac pacemaker: Secondary | ICD-10-CM | POA: Diagnosis not present

## 2014-06-03 DIAGNOSIS — IMO0001 Reserved for inherently not codable concepts without codable children: Secondary | ICD-10-CM | POA: Diagnosis not present

## 2014-06-08 ENCOUNTER — Ambulatory Visit: Payer: Medicare Other | Admitting: Rehabilitation

## 2014-06-08 DIAGNOSIS — IMO0001 Reserved for inherently not codable concepts without codable children: Secondary | ICD-10-CM | POA: Diagnosis not present

## 2014-06-09 ENCOUNTER — Ambulatory Visit
Admission: RE | Admit: 2014-06-09 | Discharge: 2014-06-09 | Disposition: A | Discharge status: Home / Self Care | Payer: Medicare Other | Source: Ambulatory Visit | Attending: Nephrology | Admitting: Nephrology

## 2014-06-09 DIAGNOSIS — N183 Chronic kidney disease, stage 3 unspecified: Secondary | ICD-10-CM

## 2014-06-10 ENCOUNTER — Ambulatory Visit: Payer: Medicare Other | Admitting: Rehabilitation

## 2014-06-10 DIAGNOSIS — IMO0001 Reserved for inherently not codable concepts without codable children: Secondary | ICD-10-CM | POA: Diagnosis not present

## 2014-06-15 ENCOUNTER — Ambulatory Visit: Payer: Medicare Other

## 2014-06-15 DIAGNOSIS — IMO0001 Reserved for inherently not codable concepts without codable children: Secondary | ICD-10-CM | POA: Diagnosis not present

## 2014-06-17 ENCOUNTER — Ambulatory Visit: Payer: Medicare Other

## 2014-06-17 ENCOUNTER — Ambulatory Visit (INDEPENDENT_AMBULATORY_CARE_PROVIDER_SITE_OTHER): Payer: Medicare Other | Admitting: *Deleted

## 2014-06-17 DIAGNOSIS — IMO0001 Reserved for inherently not codable concepts without codable children: Secondary | ICD-10-CM | POA: Diagnosis not present

## 2014-06-17 DIAGNOSIS — I4891 Unspecified atrial fibrillation: Secondary | ICD-10-CM

## 2014-06-17 LAB — POCT INR: INR: 2.5

## 2014-06-22 ENCOUNTER — Ambulatory Visit: Payer: Medicare Other | Admitting: Physical Therapy

## 2014-06-22 DIAGNOSIS — IMO0001 Reserved for inherently not codable concepts without codable children: Secondary | ICD-10-CM | POA: Diagnosis not present

## 2014-06-23 ENCOUNTER — Other Ambulatory Visit: Payer: Self-pay | Admitting: Cardiology

## 2014-06-24 ENCOUNTER — Encounter: Payer: Medicare Other | Admitting: Physical Therapy

## 2014-06-26 ENCOUNTER — Emergency Department (HOSPITAL_COMMUNITY): Payer: Medicare Other

## 2014-06-26 ENCOUNTER — Emergency Department (HOSPITAL_COMMUNITY)
Admission: EM | Admit: 2014-06-26 | Discharge: 2014-06-26 | Disposition: A | Discharge status: Home / Self Care | Payer: Medicare Other | Attending: Emergency Medicine | Admitting: Emergency Medicine

## 2014-06-26 ENCOUNTER — Encounter (HOSPITAL_COMMUNITY): Payer: Self-pay | Admitting: Emergency Medicine

## 2014-06-26 DIAGNOSIS — S62009A Unspecified fracture of navicular [scaphoid] bone of unspecified wrist, initial encounter for closed fracture: Secondary | ICD-10-CM | POA: Diagnosis not present

## 2014-06-26 DIAGNOSIS — E559 Vitamin D deficiency, unspecified: Secondary | ICD-10-CM | POA: Insufficient documentation

## 2014-06-26 DIAGNOSIS — S0083XA Contusion of other part of head, initial encounter: Secondary | ICD-10-CM | POA: Insufficient documentation

## 2014-06-26 DIAGNOSIS — Y92009 Unspecified place in unspecified non-institutional (private) residence as the place of occurrence of the external cause: Secondary | ICD-10-CM | POA: Diagnosis not present

## 2014-06-26 DIAGNOSIS — Z9889 Other specified postprocedural states: Secondary | ICD-10-CM | POA: Insufficient documentation

## 2014-06-26 DIAGNOSIS — Z8719 Personal history of other diseases of the digestive system: Secondary | ICD-10-CM | POA: Insufficient documentation

## 2014-06-26 DIAGNOSIS — N183 Chronic kidney disease, stage 3 unspecified: Secondary | ICD-10-CM | POA: Insufficient documentation

## 2014-06-26 DIAGNOSIS — E1142 Type 2 diabetes mellitus with diabetic polyneuropathy: Secondary | ICD-10-CM | POA: Insufficient documentation

## 2014-06-26 DIAGNOSIS — Y9389 Activity, other specified: Secondary | ICD-10-CM | POA: Diagnosis not present

## 2014-06-26 DIAGNOSIS — I129 Hypertensive chronic kidney disease with stage 1 through stage 4 chronic kidney disease, or unspecified chronic kidney disease: Secondary | ICD-10-CM | POA: Diagnosis not present

## 2014-06-26 DIAGNOSIS — I4891 Unspecified atrial fibrillation: Secondary | ICD-10-CM | POA: Insufficient documentation

## 2014-06-26 DIAGNOSIS — Z862 Personal history of diseases of the blood and blood-forming organs and certain disorders involving the immune mechanism: Secondary | ICD-10-CM | POA: Diagnosis not present

## 2014-06-26 DIAGNOSIS — Z860100 Personal history of colon polyps, unspecified: Secondary | ICD-10-CM | POA: Insufficient documentation

## 2014-06-26 DIAGNOSIS — S0990XA Unspecified injury of head, initial encounter: Secondary | ICD-10-CM | POA: Diagnosis present

## 2014-06-26 DIAGNOSIS — E1149 Type 2 diabetes mellitus with other diabetic neurological complication: Secondary | ICD-10-CM | POA: Insufficient documentation

## 2014-06-26 DIAGNOSIS — M1990 Unspecified osteoarthritis, unspecified site: Secondary | ICD-10-CM | POA: Diagnosis not present

## 2014-06-26 DIAGNOSIS — Z85828 Personal history of other malignant neoplasm of skin: Secondary | ICD-10-CM | POA: Insufficient documentation

## 2014-06-26 DIAGNOSIS — S0003XA Contusion of scalp, initial encounter: Secondary | ICD-10-CM | POA: Diagnosis not present

## 2014-06-26 DIAGNOSIS — W1809XA Striking against other object with subsequent fall, initial encounter: Secondary | ICD-10-CM | POA: Diagnosis not present

## 2014-06-26 DIAGNOSIS — M109 Gout, unspecified: Secondary | ICD-10-CM | POA: Diagnosis not present

## 2014-06-26 DIAGNOSIS — Z79899 Other long term (current) drug therapy: Secondary | ICD-10-CM | POA: Insufficient documentation

## 2014-06-26 DIAGNOSIS — E669 Obesity, unspecified: Secondary | ICD-10-CM | POA: Insufficient documentation

## 2014-06-26 DIAGNOSIS — G8929 Other chronic pain: Secondary | ICD-10-CM | POA: Diagnosis not present

## 2014-06-26 DIAGNOSIS — W19XXXA Unspecified fall, initial encounter: Secondary | ICD-10-CM

## 2014-06-26 DIAGNOSIS — Z7901 Long term (current) use of anticoagulants: Secondary | ICD-10-CM | POA: Diagnosis not present

## 2014-06-26 DIAGNOSIS — S62001A Unspecified fracture of navicular [scaphoid] bone of right wrist, initial encounter for closed fracture: Secondary | ICD-10-CM

## 2014-06-26 DIAGNOSIS — M129 Arthropathy, unspecified: Secondary | ICD-10-CM | POA: Diagnosis not present

## 2014-06-26 DIAGNOSIS — S1093XA Contusion of unspecified part of neck, initial encounter: Secondary | ICD-10-CM

## 2014-06-26 LAB — CBC
HCT: 33.5 % — ABNORMAL LOW (ref 36.0–46.0)
Hemoglobin: 10.9 g/dL — ABNORMAL LOW (ref 12.0–15.0)
MCH: 30.3 pg (ref 26.0–34.0)
MCHC: 32.5 g/dL (ref 30.0–36.0)
MCV: 93.1 fL (ref 78.0–100.0)
PLATELETS: 252 10*3/uL (ref 150–400)
RBC: 3.6 MIL/uL — AB (ref 3.87–5.11)
RDW: 15.6 % — ABNORMAL HIGH (ref 11.5–15.5)
WBC: 6 10*3/uL (ref 4.0–10.5)

## 2014-06-26 LAB — BASIC METABOLIC PANEL
ANION GAP: 13 (ref 5–15)
BUN: 31 mg/dL — ABNORMAL HIGH (ref 6–23)
CHLORIDE: 101 meq/L (ref 96–112)
CO2: 26 mEq/L (ref 19–32)
Calcium: 9.6 mg/dL (ref 8.4–10.5)
Creatinine, Ser: 1.43 mg/dL — ABNORMAL HIGH (ref 0.50–1.10)
GFR calc non Af Amer: 33 mL/min — ABNORMAL LOW (ref >90)
GFR, EST AFRICAN AMERICAN: 39 mL/min — AB (ref >90)
Glucose, Bld: 95 mg/dL (ref 70–99)
Potassium: 4.2 mEq/L (ref 3.7–5.3)
SODIUM: 140 meq/L (ref 137–147)

## 2014-06-26 LAB — PROTIME-INR
INR: 3.08 — ABNORMAL HIGH (ref 0.00–1.49)
Prothrombin Time: 31.8 seconds — ABNORMAL HIGH (ref 11.6–15.2)

## 2014-06-26 MED ORDER — TRAMADOL HCL 50 MG PO TABS
50.0000 mg | ORAL_TABLET | Freq: Once | ORAL | Status: AC
  Administered 2014-06-26: 50 mg via ORAL
  Filled 2014-06-26: qty 1

## 2014-06-26 NOTE — ED Notes (Addendum)
Pt from home via [facility] with c/o headache with egg size hematoma to back of head s/p fall at home.  Pt reports having weakness normally on her left side and her leg gives out regularly.  Pt on coumadin.  Pt in NAD, A&O.

## 2014-06-26 NOTE — ED Provider Notes (Signed)
CSN: [id number]     Arrival date & time 06/26/14  1353 History   First MD Initiated Contact with Patient 06/26/14 1500     Chief Complaint  Patient presents with  . Head Injury  . Fall     (Consider location/radiation/quality/duration/timing/severity/associated sxs/prior Treatment) HPI Comments: Uses a walker, no dizziness, CP, SOB preceding. Fell backwards and hit head on concrete floor.  Patient is a 80 y.o. female presenting with head injury and fall. The history is provided by the patient.  Head Injury Location:  R parietal Mechanism of injury: fall   Pain details:    Quality:  Aching   Severity:  Mild   Timing:  Constant Chronicity:  New Relieved by:  Nothing Worsened by:  Nothing tried Associated symptoms: no difficulty breathing, no double vision, no loss of consciousness, no memory loss and no vomiting   Risk factors: being elderly (hx of chronic falls)   Fall Pertinent negatives include no abdominal pain and no shortness of breath.    Past Medical History  Diagnosis Date  . Pacemaker     [phone]  DR EDMUNDS  . Hypertension   . Spinal stenosis, lumbar   . Skin cancer of arm     bilaterally  . High cholesterol   . Sleep apnea     NO PROBLEMS NOW PER PATIENT , DOES NOT HAVE MACHINE (08/21/2012)  . Iron deficiency anemia   . GERD (gastroesophageal reflux disease)   . Headache(784.0)     "not too often" (08/21/2012)  . Arthritis     OSTEO, DDD, SCOLIOSIS, SPURS LUMBAR   . Chronic back pain     "from my waist down" (08/21/2012)  . Gout   . Atrial fibrillation 03/05/12    AT PPM CHECK   . Complete heart block     s/p PPM  . Diastolic dysfunction   . Type II or unspecified type diabetes mellitus with neurological manifestations, not stated as uncontrolled   . Unspecified vitamin D deficiency   . CKD (chronic kidney disease), stage III     FOLLOWED BY DR [name](NO HD)  . Renal insufficiency   . Polyneuropathy in diabetes   . Osteoarthritis   .  Obese     improving  . Bunion   . Eczema   . Colon polyps     History of  . Allergic rhinitis   . Magnesium deficiency     Improved  . Gastritis   . Renal cyst   . Lumbar herniated disc   . Vitiligo    Past Surgical History  Procedure Laterality Date  . Joint replacement      2007  LT KNEE   . Cardiac catheterization    . Excisional hemorrhoidectomy  1980's  . Cataract extraction w/ intraocular lens  implant, bilateral  1990's  . Total knee arthroplasty  04/2006    left  . Ankle fracture surgery  04/2006    left  . Back surgery      DR Patrice Paradise; "put spacers in my lower back; ~ 2009" (08/21/2012)  . Skin cancer excision      bilateral arms (08/21/2012)  . Total shoulder arthroplasty  08/21/2012    right w/RCR  . Total shoulder arthroplasty  08/21/2012    Procedure: TOTAL SHOULDER ARTHROPLASTY;  Surgeon: Marin Shutter, MD;  Location: [facility];  Service: Orthopedics;  Laterality: Right;  . Pacemaker insertion  11/10/2002    Insertion in 2004  . Pacemaker generator change  03/13/10  [employer] [phone] implanted by Dr Leonia Reeves   Family History  Problem Relation Age of Onset  . CAD Mother   . Hypertension Mother   . Diabetes Mother   . Lung cancer Father   . Hyperlipidemia Sister   . Diabetes Sister   . Arthritis Brother     rheumatoid arthritis  . Arthritis Paternal Grandmother     rheumatoid arthritis  . Hyperlipidemia Sister   . Diabetes Sister   . Hyperlipidemia Sister   . Diabetes Sister   . Arthritis Daughter     rheumatoid arthritis   History  Substance Use Topics  . Smoking status: Never Smoker   . Smokeless tobacco: Never Used  . Alcohol Use: No   OB History   Grav Para Term Preterm Abortions TAB SAB Ect Mult Living                 Review of Systems  Constitutional: Negative for fever and chills.  Eyes: Negative for double vision.  Respiratory: Negative for cough and shortness of breath.   Gastrointestinal: Negative for vomiting and abdominal  pain.  Neurological: Negative for loss of consciousness.  Psychiatric/Behavioral: Negative for memory loss.  All other systems reviewed and are negative.     Allergies  Actos; Lisinopril; Losartan; and Erythromycin  Home Medications   Prior to Admission medications   Medication Sig Start Date End Date Taking? Authorizing Provider  acetaminophen (TYLENOL) 325 MG tablet Take 650 mg by mouth 2 (two) times daily.   Yes Historical Provider, MD  alendronate (FOSAMAX) 70 MG tablet Take 70 mg by mouth every Sunday. Sundays, with a full glass of water on an empty stomach.   Yes Historical Provider, MD  allopurinol (ZYLOPRIM) 100 MG tablet Take 500 mg by mouth daily.   Yes Historical Provider, MD  Ascorbic Acid (VITAMIN C PO) Take 1 tablet by mouth daily.   Yes Historical Provider, MD  furosemide (LASIX) 40 MG tablet Take 40 mg by mouth every evening.    Yes Historical Provider, MD  gabapentin (NEURONTIN) 300 MG capsule Take 600 mg by mouth 3 (three) times daily.    Yes Historical Provider, MD  magnesium oxide (MAG-OX) 400 MG tablet Take 400 mg by mouth daily.   Yes Historical Provider, MD  metoprolol (LOPRESSOR) 50 MG tablet Take 50 mg by mouth 2 (two) times daily.    Yes Historical Provider, MD  Multiple Vitamin (MULTIVITAMIN) tablet Take 1 tablet by mouth daily.   Yes Historical Provider, MD  simvastatin (ZOCOR) 40 MG tablet Take 40 mg by mouth every evening.   Yes Historical Provider, MD  traMADol (ULTRAM) 50 MG tablet Take 100 mg by mouth 3 (three) times daily.   Yes Historical Provider, MD  Vitamin D, Ergocalciferol, (DRISDOL) 50000 UNITS CAPS Take 50,000 Units by mouth every Sunday.    Yes Historical Provider, MD  warfarin (COUMADIN) 2 MG tablet Take 2-4 mg by mouth daily. 1 tablet (2 mg) on Mon and Fri.  2 tablets (4 mg) on all other days.   Yes Historical Provider, MD   BP 124/59  Pulse 59  Temp(Src) 98.3 F (36.8 C) (Oral)  Resp 14  SpO2 95% Physical Exam  Nursing note and vitals  reviewed. Constitutional: She is oriented to person, place, and time. She appears well-developed and well-nourished. No distress.  HENT:  Head: Normocephalic.    Mouth/Throat: Oropharynx is clear and moist.  Eyes: EOM are normal. Pupils are equal, round, and reactive to light.  Neck: Normal range of motion. Neck supple.  Cardiovascular: Normal rate and regular rhythm.  Exam reveals no friction rub.   No murmur heard. Pulmonary/Chest: Effort normal and breath sounds normal. No respiratory distress. She has no wheezes. She has no rales.  Abdominal: Soft. She exhibits no distension. There is no tenderness. There is no rebound.  Musculoskeletal: She exhibits no edema.       Right wrist: She exhibits tenderness, bony tenderness (distal radius) and swelling. She exhibits normal range of motion.  Neurological: She is alert and oriented to person, place, and time.  Skin: She is not diaphoretic.    ED Course  Procedures (including critical care time) Labs Review Labs Reviewed  CBC  BASIC METABOLIC PANEL  PROTIME-INR    Imaging Review Dg Wrist Complete Right  06/26/2014   CLINICAL DATA:  80 year old with fall and wrist pain.  EXAM: RIGHT WRIST - COMPLETE 3+ VIEW  COMPARISON:  None.  FINDINGS: VISI (Volar intercalated segmental instability) changes are identified. Degenerative changes at the radiocarpal joint and first carpometacarpal joint are noted.  No definite acute fracture or dislocation noted.  Soft tissue swelling is present.  IMPRESSION: No definite acute bony abnormality.  VISI and degenerative changes as described.   Electronically Signed   By: Hassan Rowan M.D.   On: 06/26/2014 15:45   Ct Head Wo Contrast  06/26/2014   CLINICAL DATA:  Headache. History of trauma from a fall with hematoma on the back of the head.  EXAM: CT HEAD WITHOUT CONTRAST  TECHNIQUE: Contiguous axial images were obtained from the base of the skull through the vertex without intravenous contrast.  COMPARISON:  Head  CT 09/04/2013.  FINDINGS: Soft tissue swelling in the right parietal scalp, compatible with the reported contusion and small scalp hematoma. Mild cerebral atrophy. Patchy and confluent areas of decreased attenuation are noted throughout the deep and periventricular white matter of the cerebral hemispheres bilaterally, compatible with chronic microvascular ischemic disease. Physiologic calcifications in the basal ganglia bilaterally. No acute displaced skull fractures are identified. No acute intracranial abnormality. Specifically, no evidence of acute post-traumatic intracranial hemorrhage, no definite regions of acute/subacute cerebral ischemia, no focal mass, mass effect, hydrocephalus or abnormal intra or extra-axial fluid collections. The visualized paranasal sinuses and mastoids are well pneumatized.  IMPRESSION: 1. Right parietal scalp contusion/hematoma. No underlying displaced skull fracture or signs of significant acute intracranial trauma. 2. Mild cerebral atrophy with mild chronic microvascular ischemic changes in the cerebral white matter, as above.   Electronically Signed   By: Vinnie Langton M.D.   On: 06/26/2014 15:27     EKG Interpretation None      MDM   Final diagnoses:  Scaphoid fracture of wrist, right, closed, initial encounter  Scalp hematoma, initial encounter  Fall, initial encounter    68F presents with fall. Fell at home, no LOC. Hit head on concrete floor. No preceding symptoms. Alert, oriented, acting like herself. Also having some R wrist pain. Hx of chronic arthritis with R wrist pain. R wrist swelling today, will xray. On coumadin for Afib. CT Head and labs ordered. Imaging ok. R wrist with snuffbox tenderness, will place in velcro thumb spica, given Ortho f/u in 1 week. Stable for discharge.  Evelina Bucy, MD 06/26/14 [phone]

## 2014-06-26 NOTE — Discharge Instructions (Signed)
Scaphoid Fracture, Wrist A fracture is a break in the bone. The bone you have broken often does not show up as a fracture on x-ray until later on in the healing phase. This bone is called the scaphoid bone. With this bone, your caregiver will often cast or splint your wrist as though it is fractured, even if a fracture is not seen on the x-ray. This is often done with wrist injuries in which there is tenderness at the base of the thumb. An x-ray at 1-3 weeks after your injury may confirm this fracture. A cast or splint is used to protect and keep your injured bone in good position for healing. The cast or splint will be on generally for about 6 to 16 weeks, depending on your health, age, the fracture location and how quickly you heal. Another name for the scaphoid bone is the navicular bone. HOME CARE INSTRUCTIONS   To lessen the swelling and pain, keep the injured part elevated above your heart while sitting or lying down.  Apply ice to the injury for 15-20 minutes, 03-04 times per day while awake, for 2 days. Put the ice in a plastic bag and place a thin towel between the bag of ice and your cast.  If you have a plaster or fiberglass cast or splint:  Do not try to scratch the skin under the cast using sharp or pointed objects.  Check the skin around the cast every day. You may put lotion on any red or sore areas.  Keep your cast or splint dry and clean.  If you have a plaster splint:  Wear the splint as directed.  You may loosen the elastic bandage around the splint if your fingers become numb, tingle, or turn cold or blue.  If you have been put in a removable splint, wear and use as directed.  Do not put pressure on any part of your cast or splint; it may deform or break. Rest your cast or splint only on a pillow the first 24 hours until it is fully hardened.  Your cast or splint can be protected during bathing with a plastic bag. Do not lower the cast or splint into water.  Only take  over-the-counter or prescription medicines for pain, discomfort, or fever as directed by your caregiver.  If your caregiver has given you a follow up appointment, it is very important to keep that appointment. Not keeping the appointment could result in chronic pain and decreased function. If there is any problem keeping the appointment, you must call back to this facility for assistance. SEEK IMMEDIATE MEDICAL CARE IF:   Your cast gets damaged, wet or breaks.  You have continued severe pain or more swelling than you did before the cast or splint was put on.  Your skin or nails below the injury turn blue or gray, or feel cold or numb.  You have tingling or burning pain in your fingers or increasing pain with movement of your fingers Document Released: 10/05/2002 Document Revised: 01/07/2012 Document Reviewed: 06/03/2009 Exit[employer] Patient Information 2015 [employer]. This information is not intended to replace advice given to you by your health care provider. Make sure you discuss any questions you have with your health care provider.  

## 2014-06-29 ENCOUNTER — Encounter: Payer: Medicare Other | Admitting: Rehabilitation

## 2014-06-30 ENCOUNTER — Encounter: Payer: Medicare Other | Admitting: Rehabilitation

## 2014-07-06 ENCOUNTER — Ambulatory Visit: Payer: Medicare Other | Attending: Family Medicine | Admitting: Rehabilitation

## 2014-07-06 DIAGNOSIS — IMO0001 Reserved for inherently not codable concepts without codable children: Secondary | ICD-10-CM | POA: Insufficient documentation

## 2014-07-06 DIAGNOSIS — Z950 Presence of cardiac pacemaker: Secondary | ICD-10-CM | POA: Insufficient documentation

## 2014-07-06 DIAGNOSIS — R269 Unspecified abnormalities of gait and mobility: Secondary | ICD-10-CM | POA: Diagnosis not present

## 2014-07-06 DIAGNOSIS — M545 Low back pain, unspecified: Secondary | ICD-10-CM | POA: Diagnosis not present

## 2014-07-06 DIAGNOSIS — M51379 Other intervertebral disc degeneration, lumbosacral region without mention of lumbar back pain or lower extremity pain: Secondary | ICD-10-CM | POA: Insufficient documentation

## 2014-07-06 DIAGNOSIS — I442 Atrioventricular block, complete: Secondary | ICD-10-CM | POA: Insufficient documentation

## 2014-07-06 DIAGNOSIS — M5137 Other intervertebral disc degeneration, lumbosacral region: Secondary | ICD-10-CM | POA: Insufficient documentation

## 2014-07-06 DIAGNOSIS — R293 Abnormal posture: Secondary | ICD-10-CM | POA: Diagnosis not present

## 2014-07-07 ENCOUNTER — Ambulatory Visit: Payer: Medicare Other | Admitting: Rehabilitation

## 2014-07-07 DIAGNOSIS — IMO0001 Reserved for inherently not codable concepts without codable children: Secondary | ICD-10-CM | POA: Diagnosis not present

## 2014-07-14 ENCOUNTER — Ambulatory Visit: Payer: Medicare Other | Admitting: Rehabilitation

## 2014-07-14 DIAGNOSIS — IMO0001 Reserved for inherently not codable concepts without codable children: Secondary | ICD-10-CM | POA: Diagnosis not present

## 2014-07-15 ENCOUNTER — Ambulatory Visit (INDEPENDENT_AMBULATORY_CARE_PROVIDER_SITE_OTHER): Payer: Medicare Other | Admitting: *Deleted

## 2014-07-15 DIAGNOSIS — I4891 Unspecified atrial fibrillation: Secondary | ICD-10-CM

## 2014-07-15 LAB — POCT INR: INR: 2.3

## 2014-07-20 ENCOUNTER — Ambulatory Visit: Payer: Medicare Other | Admitting: Physical Therapy

## 2014-07-20 DIAGNOSIS — IMO0001 Reserved for inherently not codable concepts without codable children: Secondary | ICD-10-CM | POA: Diagnosis not present

## 2014-07-27 ENCOUNTER — Ambulatory Visit: Payer: Medicare Other | Admitting: Rehabilitation

## 2014-07-27 DIAGNOSIS — IMO0001 Reserved for inherently not codable concepts without codable children: Secondary | ICD-10-CM | POA: Diagnosis not present

## 2014-08-26 ENCOUNTER — Ambulatory Visit (INDEPENDENT_AMBULATORY_CARE_PROVIDER_SITE_OTHER): Payer: Medicare Other | Admitting: *Deleted

## 2014-08-26 DIAGNOSIS — I4891 Unspecified atrial fibrillation: Secondary | ICD-10-CM

## 2014-08-26 LAB — POCT INR: INR: 3.1

## 2014-09-16 ENCOUNTER — Ambulatory Visit (INDEPENDENT_AMBULATORY_CARE_PROVIDER_SITE_OTHER): Payer: Medicare Other

## 2014-09-16 DIAGNOSIS — I4891 Unspecified atrial fibrillation: Secondary | ICD-10-CM

## 2014-09-16 LAB — POCT INR: INR: 2.6

## 2014-10-13 ENCOUNTER — Encounter (HOSPITAL_COMMUNITY): Payer: Self-pay | Admitting: Critical Care Medicine

## 2014-10-13 ENCOUNTER — Emergency Department (HOSPITAL_COMMUNITY): Payer: Medicare Other

## 2014-10-13 ENCOUNTER — Encounter (HOSPITAL_COMMUNITY): Admission: EM | Disposition: E | Payer: Self-pay | Source: Home / Self Care

## 2014-10-13 ENCOUNTER — Encounter (HOSPITAL_COMMUNITY): Payer: Self-pay | Admitting: Physical Medicine and Rehabilitation

## 2014-10-13 ENCOUNTER — Inpatient Hospital Stay (HOSPITAL_COMMUNITY)
Admission: EM | Admit: 2014-10-13 | Discharge: 2014-10-29 | DRG: 551 | Disposition: E | Payer: Medicare Other | Attending: General Surgery | Admitting: General Surgery

## 2014-10-13 DIAGNOSIS — J9690 Respiratory failure, unspecified, unspecified whether with hypoxia or hypercapnia: Secondary | ICD-10-CM | POA: Diagnosis present

## 2014-10-13 DIAGNOSIS — R570 Cardiogenic shock: Secondary | ICD-10-CM | POA: Diagnosis present

## 2014-10-13 DIAGNOSIS — I469 Cardiac arrest, cause unspecified: Secondary | ICD-10-CM | POA: Diagnosis not present

## 2014-10-13 DIAGNOSIS — R40243 Glasgow coma scale score 3-8: Secondary | ICD-10-CM | POA: Diagnosis present

## 2014-10-13 DIAGNOSIS — I468 Cardiac arrest due to other underlying condition: Secondary | ICD-10-CM | POA: Diagnosis not present

## 2014-10-13 DIAGNOSIS — S12110A Anterior displaced Type II dens fracture, initial encounter for closed fracture: Secondary | ICD-10-CM | POA: Diagnosis not present

## 2014-10-13 DIAGNOSIS — Z515 Encounter for palliative care: Secondary | ICD-10-CM

## 2014-10-13 DIAGNOSIS — R4020 Unspecified coma: Secondary | ICD-10-CM | POA: Diagnosis present

## 2014-10-13 DIAGNOSIS — Z66 Do not resuscitate: Secondary | ICD-10-CM | POA: Diagnosis present

## 2014-10-13 DIAGNOSIS — D62 Acute posthemorrhagic anemia: Secondary | ICD-10-CM | POA: Diagnosis present

## 2014-10-13 DIAGNOSIS — G8250 Quadriplegia, unspecified: Secondary | ICD-10-CM | POA: Diagnosis present

## 2014-10-13 DIAGNOSIS — R578 Other shock: Secondary | ICD-10-CM | POA: Diagnosis present

## 2014-10-13 DIAGNOSIS — Z79899 Other long term (current) drug therapy: Secondary | ICD-10-CM

## 2014-10-13 DIAGNOSIS — N179 Acute kidney failure, unspecified: Secondary | ICD-10-CM | POA: Diagnosis present

## 2014-10-13 DIAGNOSIS — S41102A Unspecified open wound of left upper arm, initial encounter: Secondary | ICD-10-CM | POA: Diagnosis present

## 2014-10-13 DIAGNOSIS — T1490XA Injury, unspecified, initial encounter: Secondary | ICD-10-CM

## 2014-10-13 DIAGNOSIS — I4891 Unspecified atrial fibrillation: Secondary | ICD-10-CM | POA: Diagnosis present

## 2014-10-13 DIAGNOSIS — S41101A Unspecified open wound of right upper arm, initial encounter: Secondary | ICD-10-CM | POA: Diagnosis present

## 2014-10-13 DIAGNOSIS — S0083XA Contusion of other part of head, initial encounter: Secondary | ICD-10-CM | POA: Diagnosis present

## 2014-10-13 DIAGNOSIS — Z7901 Long term (current) use of anticoagulants: Secondary | ICD-10-CM

## 2014-10-13 DIAGNOSIS — R092 Respiratory arrest: Secondary | ICD-10-CM

## 2014-10-13 DIAGNOSIS — S0990XA Unspecified injury of head, initial encounter: Secondary | ICD-10-CM | POA: Diagnosis present

## 2014-10-13 DIAGNOSIS — G931 Anoxic brain damage, not elsewhere classified: Secondary | ICD-10-CM | POA: Diagnosis present

## 2014-10-13 LAB — COMPREHENSIVE METABOLIC PANEL
ALK PHOS: 90 U/L (ref 39–117)
ALT: 29 U/L (ref 0–35)
AST: 44 U/L — ABNORMAL HIGH (ref 0–37)
Albumin: 3.7 g/dL (ref 3.5–5.2)
Anion gap: 19 — ABNORMAL HIGH (ref 5–15)
BILIRUBIN TOTAL: 0.3 mg/dL (ref 0.3–1.2)
BUN: 45 mg/dL — ABNORMAL HIGH (ref 6–23)
CO2: 20 mEq/L (ref 19–32)
Calcium: 9 mg/dL (ref 8.4–10.5)
Chloride: 95 mEq/L — ABNORMAL LOW (ref 96–112)
Creatinine, Ser: 1.3 mg/dL — ABNORMAL HIGH (ref 0.50–1.10)
GFR calc non Af Amer: 37 mL/min — ABNORMAL LOW (ref >90)
GFR, EST AFRICAN AMERICAN: 43 mL/min — AB (ref >90)
GLUCOSE: 184 mg/dL — AB (ref 70–99)
POTASSIUM: 4.3 meq/L (ref 3.7–5.3)
SODIUM: 134 meq/L — AB (ref 137–147)
TOTAL PROTEIN: 7.4 g/dL (ref 6.0–8.3)

## 2014-10-13 LAB — TYPE AND SCREEN
ABO/RH(D): O POS
Antibody Screen: NEGATIVE
Unit division: 0
Unit division: 0

## 2014-10-13 LAB — BLOOD GAS, ARTERIAL
ACID-BASE DEFICIT: 7.2 mmol/L — AB (ref 0.0–2.0)
Bicarbonate: 18.6 mEq/L — ABNORMAL LOW (ref 20.0–24.0)
Drawn by: 313061
FIO2: 1 %
LHR: 12 {breaths}/min
O2 SAT: 98.9 %
PATIENT TEMPERATURE: 98.6
PEEP/CPAP: 5 cmH2O
TCO2: 19.9 mmol/L (ref 0–100)
VT: 570 mL
pCO2 arterial: 42.8 mmHg (ref 35.0–45.0)
pH, Arterial: 7.261 — ABNORMAL LOW (ref 7.350–7.450)
pO2, Arterial: 251 mmHg — ABNORMAL HIGH (ref 80.0–100.0)

## 2014-10-13 LAB — ETHANOL: Alcohol, Ethyl (B): <11 mg/dL (ref 0–11)

## 2014-10-13 LAB — CBC
HCT: 26.8 % — ABNORMAL LOW (ref 36.0–46.0)
HEMATOCRIT: 36.3 % (ref 36.0–46.0)
HEMOGLOBIN: 11.4 g/dL — AB (ref 12.0–15.0)
Hemoglobin: 8.8 g/dL — ABNORMAL LOW (ref 12.0–15.0)
MCH: 29.8 pg (ref 26.0–34.0)
MCH: 30.9 pg (ref 26.0–34.0)
MCHC: 31.4 g/dL (ref 30.0–36.0)
MCHC: 32.8 g/dL (ref 30.0–36.0)
MCV: 95 fL (ref 78.0–100.0)
MCV: 94 fL (ref 78.0–100.0)
Platelets: 275 10*3/uL (ref 150–400)
Platelets: 205 10*3/uL (ref 150–400)
RBC: 3.82 MIL/uL — ABNORMAL LOW (ref 3.87–5.11)
RBC: 2.85 MIL/uL — ABNORMAL LOW (ref 3.87–5.11)
RDW: 16.3 % — ABNORMAL HIGH (ref 11.5–15.5)
RDW: 16.2 % — AB (ref 11.5–15.5)
WBC: 15.2 10*3/uL — ABNORMAL HIGH (ref 4.0–10.5)
WBC: 12.1 10*3/uL — ABNORMAL HIGH (ref 4.0–10.5)

## 2014-10-13 LAB — PREPARE FRESH FROZEN PLASMA
UNIT DIVISION: 0
Unit division: 0

## 2014-10-13 LAB — PROTIME-INR
INR: 3.22 — ABNORMAL HIGH (ref 0.00–1.49)
Prothrombin Time: 33.2 seconds — ABNORMAL HIGH (ref 11.6–15.2)

## 2014-10-13 LAB — URINALYSIS, ROUTINE W REFLEX MICROSCOPIC
BILIRUBIN URINE: NEGATIVE
GLUCOSE, UA: NEGATIVE mg/dL
Ketones, ur: NEGATIVE mg/dL
Nitrite: NEGATIVE
PH: 6 (ref 5.0–8.0)
Protein, ur: 100 mg/dL — AB
Specific Gravity, Urine: 1.013 (ref 1.005–1.030)
Urobilinogen, UA: 0.2 mg/dL (ref 0.0–1.0)

## 2014-10-13 LAB — I-STAT CHEM 8, ED
BUN: 49 mg/dL — ABNORMAL HIGH (ref 6–23)
CHLORIDE: 100 meq/L (ref 96–112)
CREATININE: 1.5 mg/dL — AB (ref 0.50–1.10)
Calcium, Ion: 1.21 mmol/L (ref 1.13–1.30)
GLUCOSE: 184 mg/dL — AB (ref 70–99)
HCT: 39 % (ref 36.0–46.0)
Hemoglobin: 13.3 g/dL (ref 12.0–15.0)
Potassium: 4.1 mEq/L (ref 3.7–5.3)
Sodium: 136 mEq/L — ABNORMAL LOW (ref 137–147)
TCO2: 24 mmol/L (ref 0–100)

## 2014-10-13 LAB — URINE MICROSCOPIC-ADD ON

## 2014-10-13 LAB — ABO/RH: ABO/RH(D): O POS

## 2014-10-13 LAB — CDS SEROLOGY

## 2014-10-13 SURGERY — LAPAROTOMY, EXPLORATORY
Anesthesia: General

## 2014-10-13 MED ORDER — SODIUM CHLORIDE 0.9 % IV BOLUS (SEPSIS)
1000.0000 mL | Freq: Once | INTRAVENOUS | Status: AC
  Administered 2014-10-13: 1000 mL via INTRAVENOUS

## 2014-10-13 MED ORDER — PHENYLEPHRINE HCL 10 MG/ML IJ SOLN
0.0000 ug/min | Freq: Once | INTRAVENOUS | Status: AC
  Administered 2014-10-13: 20 ug/min via INTRAVENOUS
  Filled 2014-10-13: qty 1

## 2014-10-13 MED ORDER — PHENYLEPHRINE HCL 10 MG/ML IJ SOLN
0.0000 ug/min | INTRAMUSCULAR | Status: DC
  Filled 2014-10-13: qty 1

## 2014-10-13 MED ORDER — ONDANSETRON HCL 4 MG/2ML IJ SOLN: 4.0000 mg | Freq: Four times a day (QID) | INTRAMUSCULAR | Status: DC | PRN

## 2014-10-13 MED ORDER — SODIUM CHLORIDE 0.9 % IV SOLN
INTRAVENOUS | Status: DC
  Administered 2014-10-13 (×2): via INTRAVENOUS

## 2014-10-13 MED ORDER — DIPHENHYDRAMINE HCL 50 MG/ML IJ SOLN: 12.5000 mg | Freq: Four times a day (QID) | INTRAMUSCULAR | Status: DC | PRN

## 2014-10-13 MED ORDER — PANTOPRAZOLE SODIUM 40 MG IV SOLR
40.0000 mg | Freq: Every day | INTRAVENOUS | Status: DC
Start: 2014-10-13 — End: 2014-10-14
  Administered 2014-10-13: 40 mg via INTRAVENOUS
  Filled 2014-10-13 (×2): qty 40

## 2014-10-13 MED ORDER — FENTANYL CITRATE 0.05 MG/ML IJ SOLN: 12.5000 ug | INTRAMUSCULAR | Status: DC | PRN

## 2014-10-13 MED ORDER — DIPHENHYDRAMINE HCL 12.5 MG/5ML PO ELIX
12.5000 mg | ORAL_SOLUTION | Freq: Four times a day (QID) | ORAL | Status: DC | PRN
  Filled 2014-10-13: qty 5

## 2014-10-13 MED ORDER — IOHEXOL 300 MG/ML  SOLN
100.0000 mL | Freq: Once | INTRAMUSCULAR | Status: AC | PRN
  Administered 2014-10-13: 100 mL via INTRAVENOUS

## 2014-10-13 MED FILL — Medication: Qty: 1 | Status: AC

## 2014-10-13 SURGICAL SUPPLY — 43 items
BLADE SURG ROTATE 9660 (MISCELLANEOUS) IMPLANT
CANISTER SUCTION 2500CC (MISCELLANEOUS) ×3 IMPLANT
CHLORAPREP W/TINT 26ML (MISCELLANEOUS) ×3 IMPLANT
COVER MAYO STAND STRL (DRAPES) IMPLANT
COVER SURGICAL LIGHT HANDLE (MISCELLANEOUS) ×3 IMPLANT
DRAPE LAPAROSCOPIC ABDOMINAL (DRAPES) ×3 IMPLANT
DRAPE PROXIMA HALF (DRAPES) IMPLANT
DRAPE UTILITY XL STRL (DRAPES) ×6 IMPLANT
DRAPE WARM FLUID 44X44 (DRAPE) ×3 IMPLANT
DRSG OPSITE POSTOP 4X10 (GAUZE/BANDAGES/DRESSINGS) IMPLANT
DRSG OPSITE POSTOP 4X8 (GAUZE/BANDAGES/DRESSINGS) IMPLANT
ELECT BLADE 6.5 EXT (BLADE) IMPLANT
ELECT CAUTERY BLADE 6.4 (BLADE) ×6 IMPLANT
ELECT REM PT RETURN 9FT ADLT (ELECTROSURGICAL) ×3
ELECTRODE REM PT RTRN 9FT ADLT (ELECTROSURGICAL) ×1 IMPLANT
GLOVE BIOGEL PI IND STRL 8 (GLOVE) ×1 IMPLANT
GLOVE BIOGEL PI INDICATOR 8 (GLOVE) ×2
GLOVE ECLIPSE 7.5 STRL STRAW (GLOVE) ×3 IMPLANT
GOWN STRL REUS W/ TWL LRG LVL3 (GOWN DISPOSABLE) ×3 IMPLANT
GOWN STRL REUS W/TWL LRG LVL3 (GOWN DISPOSABLE) ×6
KIT BASIN OR (CUSTOM PROCEDURE TRAY) ×3 IMPLANT
KIT ROOM TURNOVER OR (KITS) ×3 IMPLANT
LIGASURE IMPACT 36 18CM CVD LR (INSTRUMENTS) IMPLANT
NS IRRIG 1000ML POUR BTL (IV SOLUTION) ×6 IMPLANT
PACK GENERAL/GYN (CUSTOM PROCEDURE TRAY) ×3 IMPLANT
PAD ARMBOARD 7.5X6 YLW CONV (MISCELLANEOUS) ×3 IMPLANT
PENCIL BUTTON HOLSTER BLD 10FT (ELECTRODE) IMPLANT
SEPRAFILM PROCEDURAL PACK 3X5 (MISCELLANEOUS) IMPLANT
SPECIMEN JAR LARGE (MISCELLANEOUS) IMPLANT
SPONGE LAP 18X18 X RAY DECT (DISPOSABLE) IMPLANT
STAPLER VISISTAT 35W (STAPLE) ×3 IMPLANT
SUCTION POOLE TIP (SUCTIONS) ×3 IMPLANT
SUT NOVA 1 T20/GS 25DT (SUTURE) IMPLANT
SUT PDS AB 1 TP1 96 (SUTURE) ×6 IMPLANT
SUT SILK 2 0 SH CR/8 (SUTURE) ×3 IMPLANT
SUT SILK 2 0 TIES 10X30 (SUTURE) ×3 IMPLANT
SUT SILK 3 0 SH CR/8 (SUTURE) ×3 IMPLANT
SUT SILK 3 0 TIES 10X30 (SUTURE) ×3 IMPLANT
TOWEL OR 17X26 10 PK STRL BLUE (TOWEL DISPOSABLE) ×3 IMPLANT
TRAY FOLEY CATH 16FRSI W/METER (SET/KITS/TRAYS/PACK) IMPLANT
TUBE CONNECTING 12'X1/4 (SUCTIONS)
TUBE CONNECTING 12X1/4 (SUCTIONS) IMPLANT
YANKAUER SUCT BULB TIP NO VENT (SUCTIONS) IMPLANT

## 2014-10-13 NOTE — ED Notes (Signed)
Dr. Sabra Heck placing central line at the time.

## 2014-10-13 NOTE — Progress Notes (Signed)
Pt transported upon arrival and intubation to CT and back to EDTRB on vent.  Pt also transported from EDTRB to 3106 on vent.  No complications noted.

## 2014-10-13 NOTE — ED Notes (Signed)
CPR stopped, peripheral pulses palpated.

## 2014-10-13 NOTE — Progress Notes (Signed)
Patient went into cardiac arrest and had CPR and active resuscitation.  She has responded physiologically, but neurologically she has not she has not shown any signs of life.  Although her head CT was negative, the patient has not had any neurological activity since arrival.  That long with the CPR in the field and on arrival signifies a very poor prognosis.  The functional survival is essentially 0,  She will never live off of a ventilator and live outside of a facility.  She will be made a DNR, but currently she is hemodynamically stable.  This patient has been seen and I agree with the findings and treatment plan.  Kathryne Eriksson. Dahlia Bailiff, MD, [place] [phone] (pager) [phone] (direct pager) Trauma Surgeon

## 2014-10-13 NOTE — Progress Notes (Signed)
RT present at Code- AMBU at 15 lpm to ETT (off Vent). ETCO2 detector attached to Zoll- readings were verbalized multiiple times to person documenting. Per MD verbal order- place PT back on ventilator (did so at previous settings). RT will leave PT at 100% at this time (Fi02)- despite ABG results- due to PT desating during Code.

## 2014-10-13 NOTE — Progress Notes (Signed)
Pt. arrived to unit at 1240.  Pt HR brady, pt went PEA at 1315.  CPR initiated, epi given.  Pt in [place], pt shocked.  Regained pulses.  Pt's family at bedside, tearful.  Pt made a DNR at this time.  Will continue to support family.  CDS notified of update.

## 2014-10-13 NOTE — ED Notes (Signed)
Spoke with CDS, referral number [id number], ICU aware of referral.
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
## 2014-10-13 NOTE — Progress Notes (Signed)
Orthopedic Tech Progress Note Patient Details:  Tara Harmon Jul 24, 1931 [id number]  Patient ID: Tara Harmon, female   DOB: 02-01-1931, 80 y.o.   MRN: [id number] Made level 2 trauma VISIT  Hildred Priest 10/22/2014, 11:00 AM

## 2014-10-13 NOTE — ED Notes (Signed)
KING airway removed, replaced with 7.5ET tube, 26 @ lip. Positive color change. Equal bilateral breath sounds.

## 2014-10-13 NOTE — ED Notes (Signed)
CPR in progress, EDP at bedside, unable to palpate peripheral pulses at the time.
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
## 2014-10-14 DIAGNOSIS — S0083XA Contusion of other part of head, initial encounter: Secondary | ICD-10-CM | POA: Diagnosis present

## 2014-10-14 DIAGNOSIS — Z79899 Other long term (current) drug therapy: Secondary | ICD-10-CM | POA: Diagnosis not present

## 2014-10-14 DIAGNOSIS — I4891 Unspecified atrial fibrillation: Secondary | ICD-10-CM | POA: Diagnosis present

## 2014-10-14 DIAGNOSIS — R40243 Glasgow coma scale score 3-8: Secondary | ICD-10-CM | POA: Diagnosis present

## 2014-10-14 DIAGNOSIS — R578 Other shock: Secondary | ICD-10-CM | POA: Diagnosis present

## 2014-10-14 DIAGNOSIS — G8250 Quadriplegia, unspecified: Secondary | ICD-10-CM | POA: Diagnosis present

## 2014-10-14 DIAGNOSIS — G931 Anoxic brain damage, not elsewhere classified: Secondary | ICD-10-CM | POA: Diagnosis present

## 2014-10-14 DIAGNOSIS — R570 Cardiogenic shock: Secondary | ICD-10-CM | POA: Diagnosis present

## 2014-10-14 DIAGNOSIS — J9690 Respiratory failure, unspecified, unspecified whether with hypoxia or hypercapnia: Secondary | ICD-10-CM | POA: Diagnosis present

## 2014-10-14 DIAGNOSIS — Z515 Encounter for palliative care: Secondary | ICD-10-CM | POA: Diagnosis not present

## 2014-10-14 DIAGNOSIS — Z7901 Long term (current) use of anticoagulants: Secondary | ICD-10-CM | POA: Diagnosis not present

## 2014-10-14 DIAGNOSIS — S0990XA Unspecified injury of head, initial encounter: Secondary | ICD-10-CM | POA: Diagnosis present

## 2014-10-14 DIAGNOSIS — S41102A Unspecified open wound of left upper arm, initial encounter: Secondary | ICD-10-CM | POA: Diagnosis present

## 2014-10-14 DIAGNOSIS — Z66 Do not resuscitate: Secondary | ICD-10-CM | POA: Diagnosis present

## 2014-10-14 DIAGNOSIS — S12110A Anterior displaced Type II dens fracture, initial encounter for closed fracture: Secondary | ICD-10-CM | POA: Diagnosis present

## 2014-10-14 DIAGNOSIS — I469 Cardiac arrest, cause unspecified: Secondary | ICD-10-CM | POA: Diagnosis present

## 2014-10-14 DIAGNOSIS — N179 Acute kidney failure, unspecified: Secondary | ICD-10-CM | POA: Diagnosis present

## 2014-10-14 DIAGNOSIS — R4020 Unspecified coma: Secondary | ICD-10-CM | POA: Diagnosis present

## 2014-10-14 DIAGNOSIS — S41101A Unspecified open wound of right upper arm, initial encounter: Secondary | ICD-10-CM | POA: Diagnosis present

## 2014-10-14 DIAGNOSIS — I468 Cardiac arrest due to other underlying condition: Secondary | ICD-10-CM | POA: Diagnosis not present

## 2014-10-14 DIAGNOSIS — D62 Acute posthemorrhagic anemia: Secondary | ICD-10-CM | POA: Diagnosis present

## 2014-10-14 LAB — CBC
HEMATOCRIT: 24.9 % — AB (ref 36.0–46.0)
HEMOGLOBIN: 7.9 g/dL — AB (ref 12.0–15.0)
MCH: 29.4 pg (ref 26.0–34.0)
MCHC: 31.7 g/dL (ref 30.0–36.0)
MCV: 92.6 fL (ref 78.0–100.0)
Platelets: 204 10*3/uL (ref 150–400)
RBC: 2.69 MIL/uL — ABNORMAL LOW (ref 3.87–5.11)
RDW: 16.7 % — ABNORMAL HIGH (ref 11.5–15.5)
WBC: 11.7 10*3/uL — ABNORMAL HIGH (ref 4.0–10.5)

## 2014-10-14 LAB — COMPREHENSIVE METABOLIC PANEL
ALT: 33 U/L (ref 0–35)
AST: 43 U/L — ABNORMAL HIGH (ref 0–37)
Albumin: 2.7 g/dL — ABNORMAL LOW (ref 3.5–5.2)
Alkaline Phosphatase: 54 U/L (ref 39–117)
Anion gap: 15 (ref 5–15)
BUN: 45 mg/dL — AB (ref 6–23)
CALCIUM: 7.3 mg/dL — AB (ref 8.4–10.5)
CO2: 18 meq/L — AB (ref 19–32)
CREATININE: 1.64 mg/dL — AB (ref 0.50–1.10)
Chloride: 108 mEq/L (ref 96–112)
GFR calc Af Amer: 33 mL/min — ABNORMAL LOW (ref >90)
GFR, EST NON AFRICAN AMERICAN: 28 mL/min — AB (ref >90)
Glucose, Bld: 149 mg/dL — ABNORMAL HIGH (ref 70–99)
Potassium: 4.1 mEq/L (ref 3.7–5.3)
Sodium: 141 mEq/L (ref 137–147)
Total Bilirubin: 0.3 mg/dL (ref 0.3–1.2)
Total Protein: 5.2 g/dL — ABNORMAL LOW (ref 6.0–8.3)

## 2014-10-14 LAB — PROTIME-INR
INR: 4.8 — ABNORMAL HIGH (ref 0.00–1.49)
PROTHROMBIN TIME: 45.3 s — AB (ref 11.6–15.2)

## 2014-10-14 MED ORDER — MORPHINE BOLUS VIA INFUSION
5.0000 mg | INTRAVENOUS | Status: DC | PRN
  Filled 2014-10-14: qty 20

## 2014-10-14 MED ORDER — LORAZEPAM 2 MG/ML IJ SOLN
5.0000 mg/h | INTRAMUSCULAR | Status: DC
  Filled 2014-10-14: qty 25

## 2014-10-14 MED ORDER — MORPHINE SULFATE 25 MG/ML IV SOLN
10.0000 mg/h | INTRAVENOUS | Status: DC
  Administered 2014-10-14: 10 mg/h via INTRAVENOUS
  Filled 2014-10-14: qty 10

## 2014-10-14 MED ORDER — LORAZEPAM BOLUS VIA INFUSION
2.0000 mg | INTRAVENOUS | Status: DC | PRN
Start: 2014-10-14 — End: 2014-10-14
  Filled 2014-10-14: qty 5

## 2014-10-29 NOTE — Progress Notes (Signed)
Chaplain responded to spiritual care consult for end of life. Chaplain met pt family in waiting area, pt already passed and waiting on funeral home to pick pt up. Pt family said that they have been very supported by family pastors. Family appreciated chaplain checking in. Will follow as needed.    2014-10-31 1500  Clinical Encounter Type  Visited With Family;Health care provider  Visit Type Death  Referral From Physician  Spiritual Encounters  Spiritual Needs Prayer;Emotional;Grief support  Stress Factors  Family Stress Factors Loss  Zekiel Torian, Barbette Hair, Chaplain 10-31-2014 3:20 PM

## 2014-10-29 NOTE — Progress Notes (Signed)
Comfort measures initiated and pt was terminally extubated around 1400.  Family was as the bedside. TOD 1430. Two nurses verified by auscultation.  Jeanette Caprice, [place], was notified and he instructed me to leave foley, PIV and femoral central line place before sending her to the morgue.
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
## 2014-10-29 NOTE — Procedures (Signed)
Extubation Procedure Note  Patient Details:   Name: Tara Harmon DOB: 1931/10/21 MRN: [id number]   Airway Documentation:  Airway 7.5 mm (Active)  Secured at (cm) 26 cm 17-Oct-2014 10:50 AM  Measured From Lips Oct 17, 2014 10:50 AM  [place] 10-17-14  3:21 AM  Secured By [employer] Oct 17, 2014 10:50 AM  Tube Holder Repositioned Yes 10/17/2014 10:50 AM    Evaluation  O2 sats: stable throughout Complications: No apparent complications Patient did tolerate procedure well. Bilateral Breath Sounds: Clear, Diminished   No  [place] Nannette 2014/10/17, 2:03 PM   Per MD order RT followed guideline for Respiratory Therapy Services Guideline for Withdrawal of Life-Sustaining Treatment.

## 2014-10-29 NOTE — Progress Notes (Signed)
Patient ID: [name], female   DOB: Dec 14, 1930, 81 y.o.   MRN: [id number] I met with her husband, son, and daughter at the bedside. CSW also present. I reviewed her neurologic status. She remains comatose with quadraparesis. They do not feel she would want to live this way. We discussed extubation and comfort care. Some other family members will be coming to see her and the family will discuss timing of withdrawal of aggressive care. Georganna Skeans, MD, MPH, FACS Trauma: [phone] General Surgery: [phone]

## 2014-10-29 NOTE — Progress Notes (Signed)
UR completed 

## 2014-10-29 NOTE — Progress Notes (Signed)
Nutrition Brief Note  Chart reviewed. Family planning to transition to comfort care after other family members arrive.  No further nutrition interventions warranted at this time.  Please consult as needed.   [place], [place], [place] Pager [phone] After Hours Pager

## 2014-10-29 DEATH — deceased
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
## 2014-11-11 ENCOUNTER — Encounter (HOSPITAL_COMMUNITY): Payer: Self-pay | Admitting: General Surgery

## 2014-12-01 ENCOUNTER — Encounter: Payer: Medicare Other | Admitting: Internal Medicine

## 2015-02-09 ENCOUNTER — Ambulatory Visit: Payer: Self-pay | Admitting: Cardiology

## 2015-02-09 DIAGNOSIS — I4891 Unspecified atrial fibrillation: Secondary | ICD-10-CM

## 2015-05-20 ENCOUNTER — Encounter (HOSPITAL_COMMUNITY): Payer: Self-pay | Admitting: General Surgery
